# Patient Record
Sex: Female | Born: 1990 | Race: Black or African American | Hispanic: No | Marital: Single | Smoking: Never smoker
Health system: Southern US, Community
[De-identification: ages and names within clinical notes are randomized; demographics above are authoritative.]

## PROBLEM LIST (undated history)

## (undated) DIAGNOSIS — D649 Anemia, unspecified: Secondary | ICD-10-CM

## (undated) DIAGNOSIS — I10 Essential (primary) hypertension: Secondary | ICD-10-CM

## (undated) DIAGNOSIS — R519 Headache, unspecified: Secondary | ICD-10-CM

## (undated) HISTORY — DX: Anemia, unspecified: D64.9

## (undated) HISTORY — DX: Headache, unspecified: R51.9

## (undated) HISTORY — PX: WISDOM TOOTH EXTRACTION: SHX21

---

## 2011-11-17 ENCOUNTER — Ambulatory Visit: Payer: PRIVATE HEALTH INSURANCE

## 2011-11-17 DIAGNOSIS — J0100 Acute maxillary sinusitis, unspecified: Secondary | ICD-10-CM

## 2013-05-31 ENCOUNTER — Emergency Department (HOSPITAL_COMMUNITY)
Admission: EM | Admit: 2013-05-31 | Discharge: 2013-05-31 | Disposition: A | Discharge status: Home / Self Care | Payer: BC Managed Care – PPO | Attending: Emergency Medicine | Admitting: Emergency Medicine

## 2013-05-31 ENCOUNTER — Encounter (HOSPITAL_COMMUNITY): Payer: Self-pay | Admitting: Emergency Medicine

## 2013-05-31 DIAGNOSIS — L089 Local infection of the skin and subcutaneous tissue, unspecified: Secondary | ICD-10-CM | POA: Insufficient documentation

## 2013-05-31 DIAGNOSIS — Z880 Allergy status to penicillin: Secondary | ICD-10-CM | POA: Insufficient documentation

## 2013-05-31 DIAGNOSIS — W57XXXA Bitten or stung by nonvenomous insect and other nonvenomous arthropods, initial encounter: Secondary | ICD-10-CM

## 2013-05-31 DIAGNOSIS — Y939 Activity, unspecified: Secondary | ICD-10-CM | POA: Insufficient documentation

## 2013-05-31 DIAGNOSIS — Y929 Unspecified place or not applicable: Secondary | ICD-10-CM | POA: Insufficient documentation

## 2013-05-31 MED ORDER — HYDROCORTISONE 2.5 % EX LOTN: TOPICAL_LOTION | Freq: Two times a day (BID) | CUTANEOUS | Status: DC

## 2013-05-31 NOTE — ED Notes (Signed)
Patient with abscess like areas on left thigh and calf.  No drainage.  Pain only when something rubs against them.  Pain score 4/10.  Patient has never had them before.  Denies chills or fever.

## 2013-05-31 NOTE — ED Provider Notes (Signed)
History    This chart was scribed for non-physician practitioner Magnus Sinning, PA working with Benny Lennert, MD by Quintella Reichert, ED Scribe. This patient was seen in room WTR8/WTR8 and the patient's care was started at 4:24 PM.   CSN: [id number]  Arrival date & time 05/31/13  1553    Chief Complaint  Patient presents with  . Abscess    The history is provided by the patient. No language interpreter was used.     HPI Comments: Kayla Campbell is a 22 y.o. female who presents to the Emergency Department complaining of multiple gradually-worsening lesions on her left thigh that began yesterday.  Pt notes that the areas began as red swollen areas and since then the redness has spread and they have become hard.  They are mildly tender and itch.  She denies discharge from the areas.  She denies fever, chills, nausea, emesis, facial swelling, throat or tongue swelling, difficulty breathing or any other associated symptoms.  She denies recent insect bites or any other precipitating factors to her knowledge.  She denies prior h/o abscesses.   No history of DM.    History reviewed. No pertinent past medical history.   History reviewed. No pertinent past surgical history.   No family history on file.   History  Substance Use Topics  . Smoking status: Never Smoker   . Smokeless tobacco: Not on file  . Alcohol Use: No    OB History   Grav Para Term Preterm Abortions TAB SAB Ect Mult Living                   Review of Systems  Constitutional: Negative for fever and chills.  HENT: Negative for facial swelling.   Respiratory: Negative for shortness of breath.   Gastrointestinal: Negative for nausea and vomiting.  Skin:       Lesions on left leg  All other systems reviewed and are negative.      Allergies  Penicillins  Home Medications  No current outpatient prescriptions on file.  BP 143/99  Pulse 96  Temp(Src) 98.9 F (37.2 C) (Oral)  Resp 20  Wt 230  lb (104.327 kg)  SpO2 98%  Physical Exam  Nursing note and vitals reviewed. Constitutional: She appears well-developed and well-nourished. No distress.  HENT:  Head: Normocephalic and atraumatic.  Mouth/Throat: Oropharynx is clear and moist. No oropharyngeal exudate.  No swelling of lips, tongue or oropharynx.  Eyes: Conjunctivae are normal.  Neck: Neck supple.  Cardiovascular: Normal rate, regular rhythm and normal heart sounds.   No murmur heard. Pulmonary/Chest: Effort normal and breath sounds normal. No respiratory distress. She has no wheezes. She has no rales.  Neurological: She is alert.  Skin: Skin is warm and dry.  3 discrete erythematous circular areas on the left inner thigh.  No drainage.  No fluctuance palpated.  No erythematous streaking.    Psychiatric: She has a normal mood and affect. Her behavior is normal.    ED Course  Procedures (including critical care time)  DIAGNOSTIC STUDIES: Oxygen Saturation is 98% on room air, normal by my interpretation.    COORDINATION OF CARE: 4:28 PM-Discussed treatment plan which includes ice, hydrocortisone and Benadryl with pt at bedside and pt agreed to plan.     Labs Reviewed - No data to display  No results found.  No diagnosis found.  MDM  Patient presenting with three pruritic erythematous lesions of her left inner thigh.  No fluctuance present.  Appearance most consistent with insect bites.   Patient afebrile.  Feel that the patient is stable for discharge.  Patient instructed to apply Hydrocortisone to the area and take Benadryl as needed for itching.  Return precautions given.  I personally performed the services described in this documentation, which was scribed in my presence. The recorded information has been reviewed and is accurate.    Pascal Lux [place], PA-C 05/31/13 1826

## 2013-05-31 NOTE — ED Provider Notes (Signed)
Medical screening examination/treatment/procedure(s) were performed by non-physician practitioner and as supervising physician I was immediately available for consultation/collaboration.   Sandra Brents L Theia Dezeeuw, MD 05/31/13 2327 

## 2014-02-28 ENCOUNTER — Inpatient Hospital Stay (HOSPITAL_COMMUNITY)
Admission: AD | Admit: 2014-02-28 | Discharge: 2014-02-28 | Disposition: A | Discharge status: Home / Self Care | Payer: BC Managed Care – PPO | Source: Ambulatory Visit | Attending: Obstetrics and Gynecology | Admitting: Obstetrics and Gynecology

## 2014-02-28 DIAGNOSIS — Z880 Allergy status to penicillin: Secondary | ICD-10-CM | POA: Insufficient documentation

## 2014-02-28 DIAGNOSIS — A5400 Gonococcal infection of lower genitourinary tract, unspecified: Secondary | ICD-10-CM | POA: Insufficient documentation

## 2014-02-28 DIAGNOSIS — A549 Gonococcal infection, unspecified: Secondary | ICD-10-CM

## 2014-02-28 MED ORDER — AZITHROMYCIN 250 MG PO TABS
1000.0000 mg | ORAL_TABLET | Freq: Once | ORAL | Status: AC
  Administered 2014-02-28: 1000 mg via ORAL
  Filled 2014-02-28: qty 4

## 2014-02-28 MED ORDER — CEFTRIAXONE SODIUM 250 MG IJ SOLR
250.0000 mg | Freq: Once | INTRAMUSCULAR | Status: AC
  Administered 2014-02-28: 250 mg via INTRAMUSCULAR
  Filled 2014-02-28: qty 250

## 2014-02-28 NOTE — MAU Note (Signed)
Pt presents for rocephin injection for + Chalmydia. Orders received from Eveline KetoKathy Harris NP yesterday evening.

## 2014-02-28 NOTE — MAU Provider Note (Signed)
CC:  STD treatment S:  Called by the office to come to MAU to get treatment for gonorrhea. States she was coming for an injection.  No medications called in to a pharmacy. Has a penicillin allergy  - rash as a baby.  Took Benadryl PO prior to coming today as instructed by the office. O:  Vital signs stable.  Client distressed by diagnosis of GC but otherwise stable. A:  Needs treatment for gonorrhea. P: will give Rocephin 250 mg IM and Azithromycin 1000 mg PO according to current CDC treatment guidelines for gonorrhea.  Client agrees with the plan.  Partner has been treated.  Advised no sex x 7 days.  Condoms always for STD prevention.

## 2018-08-25 ENCOUNTER — Emergency Department (HOSPITAL_COMMUNITY)
Admission: EM | Admit: 2018-08-25 | Discharge: 2018-08-26 | Disposition: A | Discharge status: Home / Self Care | Payer: BLUE CROSS/BLUE SHIELD | Attending: Emergency Medicine | Admitting: Emergency Medicine

## 2018-08-25 ENCOUNTER — Encounter (HOSPITAL_COMMUNITY): Payer: Self-pay | Admitting: Emergency Medicine

## 2018-08-25 DIAGNOSIS — I159 Secondary hypertension, unspecified: Secondary | ICD-10-CM

## 2018-08-25 DIAGNOSIS — R519 Headache, unspecified: Secondary | ICD-10-CM

## 2018-08-25 DIAGNOSIS — R51 Headache: Secondary | ICD-10-CM | POA: Insufficient documentation

## 2018-08-25 DIAGNOSIS — I10 Essential (primary) hypertension: Secondary | ICD-10-CM | POA: Insufficient documentation

## 2018-08-25 HISTORY — DX: Essential (primary) hypertension: I10

## 2018-08-25 MED ORDER — LISINOPRIL 10 MG PO TABS: 10.0000 mg | ORAL_TABLET | Freq: Once | ORAL | Status: DC

## 2018-08-25 MED ORDER — ACETAMINOPHEN 500 MG PO TABS
1000.0000 mg | ORAL_TABLET | Freq: Once | ORAL | Status: AC
  Administered 2018-08-26: 1000 mg via ORAL
  Filled 2018-08-25: qty 2

## 2018-08-25 NOTE — ED Triage Notes (Signed)
Pt reports HA off/on X few days, checked her BP tonight and noted to be elevated. Pt states she has high BP and has been off her meds X few weeks.

## 2018-08-25 NOTE — ED Provider Notes (Signed)
[facility] EMERGENCY DEPARTMENT Provider Note  CSN: [id number] Arrival date & time: 08/25/18 2203  Chief Complaint(s) Hypertension  HPI Kayla Campbell is a 27 y.o. female   The history is provided by the patient.  Headache   This is a recurrent problem. The current episode started 2 days ago. The problem occurs constantly. The problem has not changed since onset.Associated with: elevated BP. Pain location: generalized. The quality of the pain is described as throbbing. The pain is moderate. The pain does not radiate. Pertinent negatives include no fever, no malaise/fatigue, no chest pressure, no near-syncope, no shortness of breath, no nausea and no vomiting. She has tried nothing for the symptoms.   Patient also noticed that when she gets her headaches she has elevated blood pressures which she has noted for the past couple days.  States that she is been off of her lisinopril since December.    Past Medical History Past Medical History:  Diagnosis Date  . Hypertension    There are no active problems to display for this patient.  Home Medication(s) Prior to Admission medications   Medication Sig Start Date End Date Taking? Authorizing Provider  hydrocortisone 2.5 % lotion Apply topically 2 (two) times daily. Patient not taking: Reported on 08/26/2018 05/31/13   Santiago Glad, PA-C                                                                                                                                    Past Surgical History History reviewed. No pertinent surgical history. Family History No family history on file.  Social History Social History   Tobacco Use  . Smoking status: Never Smoker  . Smokeless tobacco: Never Used  Substance Use Topics  . Alcohol use: Yes    Comment: Socially   . Drug use: Not Currently   Allergies Penicillins and Smz-tmp ds [sulfamethoxazole-trimethoprim]  Review of Systems Review of Systems  Constitutional:  Negative for fever and malaise/fatigue.  Respiratory: Negative for shortness of breath.   Cardiovascular: Negative for near-syncope.  Gastrointestinal: Negative for nausea and vomiting.  Neurological: Positive for headaches.   All other systems are reviewed and are negative for acute change except as noted in the HPI  Physical Exam Vital Signs  I have reviewed the triage vital signs BP (!) 173/111   Pulse 68   Temp 98.9 F (37.2 C) (Oral)   Resp 18   Ht 5\' 1"  (1.549 m)   Wt 97.5 kg   SpO2 100%   BMI 40.62 kg/m   Physical Exam  Constitutional: She is oriented to person, place, and time. She appears well-developed and well-nourished. No distress.  HENT:  Head: Normocephalic and atraumatic.  Right Ear: External ear normal.  Left Ear: External ear normal.  Nose: Nose normal.  Eyes: Conjunctivae and EOM are normal. No scleral icterus.  Fundoscopic exam:      The right eye shows no  papilledema.       The left eye shows no papilledema.  Neck: Normal range of motion and phonation normal.  Cardiovascular: Normal rate and regular rhythm.  Pulmonary/Chest: Effort normal. No stridor. No respiratory distress.  Abdominal: She exhibits no distension.  Musculoskeletal: Normal range of motion. She exhibits no edema.  Neurological: She is alert and oriented to person, place, and time.  Mental Status:  Alert and oriented to person, place, and time.  Attention and concentration normal.  Speech clear.  Recent memory is intact  Cranial Nerves:  II Visual Fields: Intact to confrontation. Visual fields intact. III, IV, VI: Pupils equal and reactive to light and near. Full eye movement without nystagmus  V Facial Sensation: Normal. No weakness of masticatory muscles  VII: No facial weakness or asymmetry  VIII Auditory Acuity: Grossly normal  IX/X: The uvula is midline; the palate elevates symmetrically  XI: Normal sternocleidomastoid and trapezius strength  XII: The tongue is midline. No  atrophy or fasciculations.   Motor System: Muscle Strength: 5/5 and symmetric in the upper and lower extremities. No pronation or drift.  Muscle Tone: Tone and muscle bulk are normal in the upper and lower extremities.   Reflexes: DTRs: 1+ and symmetrical in all four extremities. No Clonus Coordination: I No tremor.  Sensation: Intact to light touch, and pinprick. Gait: Routine gait normal.   Skin: She is not diaphoretic.  Psychiatric: She has a normal mood and affect. Her behavior is normal.  Vitals reviewed.   ED Results and Treatments Labs (all labs ordered are listed, but only abnormal results are displayed) Labs Reviewed  POC URINE PREG, ED                                                                                                                         EKG  EKG Interpretation  Date/Time:    Ventricular Rate:    PR Interval:    QRS Duration:   QT Interval:    QTC Calculation:   R Axis:     Text Interpretation:        Radiology No results found. Pertinent labs & imaging results that were available during my care of the patient were reviewed by me and considered in my medical decision making (see chart for details).  Medications Ordered in ED Medications  acetaminophen (TYLENOL) tablet 1,000 mg (1,000 mg Oral Given 08/26/18 0042)  Procedures Procedures  (including critical care time)  Medical Decision Making / ED Course I have reviewed the nursing notes for this encounter and the patient's prior records (if available in EHR or on provided paperwork).    Non focal neuro exam. No recent head trauma. No fever. Doubt meningitis. Doubt intracranial bleed. Doubt IIH. No indication for imaging.   Patient with elevated blood pressure but besides headache, she is asymptomatic.  Given Tylenol resulting in significant relief of her  headache and improved blood pressures with systolics down to the 130s.  No need for additional work-up.  Discussed PCP follow-up.  The patient appears reasonably screened and/or stabilized for discharge and I doubt any other medical condition or other [facility] requiring further screening, evaluation, or treatment in the ED at this time prior to discharge.  The patient is safe for discharge with strict return precautions.    Final Clinical Impression(s) / ED Diagnoses Final diagnoses:  Bad headache  Secondary hypertension   Disposition: Discharge  Condition: Good  I have discussed the results, Dx and Tx plan with the patient who expressed understanding and agree(s) with the plan. Discharge instructions discussed at great length. The patient was given strict return precautions who verbalized understanding of the instructions. No further questions at time of discharge.    ED Discharge Orders    None       Follow Up: Center, [facility] [address] [place] [address] [phone]  Schedule an appointment as soon as possible for a visit        This chart was dictated using voice recognition software.  Despite best efforts to proofread,  errors can occur which can change the documentation meaning.   Nira Conn, MD 08/26/18 [phone]

## 2018-08-25 NOTE — ED Notes (Signed)
Headache for 3-4 days high bp today  Was on bp med previously but  Has been off the med for awhile

## 2018-08-26 LAB — POC URINE PREG, ED: PREG TEST UR: NEGATIVE

## 2018-08-26 NOTE — ED Notes (Signed)
Up to the br 

## 2019-06-05 ENCOUNTER — Emergency Department (HOSPITAL_COMMUNITY)
Admission: EM | Admit: 2019-06-05 | Discharge: 2019-06-05 | Disposition: A | Discharge status: Home / Self Care | Payer: BC Managed Care – PPO | Attending: Family Medicine | Admitting: Family Medicine

## 2019-06-05 ENCOUNTER — Other Ambulatory Visit: Payer: Self-pay

## 2019-06-05 ENCOUNTER — Encounter (HOSPITAL_COMMUNITY): Payer: Self-pay

## 2019-06-05 ENCOUNTER — Emergency Department (HOSPITAL_COMMUNITY): Payer: BC Managed Care – PPO

## 2019-06-05 DIAGNOSIS — R110 Nausea: Secondary | ICD-10-CM | POA: Diagnosis not present

## 2019-06-05 DIAGNOSIS — R1084 Generalized abdominal pain: Secondary | ICD-10-CM | POA: Diagnosis present

## 2019-06-05 DIAGNOSIS — I10 Essential (primary) hypertension: Secondary | ICD-10-CM | POA: Diagnosis not present

## 2019-06-05 LAB — COMPREHENSIVE METABOLIC PANEL
ALT: 19 U/L (ref 0–44)
AST: 28 U/L (ref 15–41)
Albumin: 4.2 g/dL (ref 3.5–5.0)
Alkaline Phosphatase: 107 U/L (ref 38–126)
Anion gap: 10 (ref 5–15)
BUN: 13 mg/dL (ref 6–20)
CO2: 27 mmol/L (ref 22–32)
Calcium: 9.4 mg/dL (ref 8.9–10.3)
Chloride: 102 mmol/L (ref 98–111)
Creatinine, Ser: 0.73 mg/dL (ref 0.44–1.00)
GFR calc Af Amer: >60 mL/min (ref >60)
GFR calc non Af Amer: >60 mL/min (ref >60)
Glucose, Bld: 102 mg/dL — ABNORMAL HIGH (ref 70–99)
Potassium: 3.7 mmol/L (ref 3.5–5.1)
Sodium: 139 mmol/L (ref 135–145)
Total Bilirubin: 0.6 mg/dL (ref 0.3–1.2)
Total Protein: 8.6 g/dL — ABNORMAL HIGH (ref 6.5–8.1)

## 2019-06-05 LAB — CBC
HCT: 34.8 % — ABNORMAL LOW (ref 36.0–46.0)
Hemoglobin: 11.3 g/dL — ABNORMAL LOW (ref 12.0–15.0)
MCH: 26.3 pg (ref 26.0–34.0)
MCHC: 32.5 g/dL (ref 30.0–36.0)
MCV: 81.1 fL (ref 80.0–100.0)
Platelets: 384 10*3/uL (ref 150–400)
RBC: 4.29 MIL/uL (ref 3.87–5.11)
RDW: 14.4 % (ref 11.5–15.5)
WBC: 10.2 10*3/uL (ref 4.0–10.5)
nRBC: 0 % (ref 0.0–0.2)

## 2019-06-05 LAB — LIPASE, BLOOD: Lipase: 47 U/L (ref 11–51)

## 2019-06-05 LAB — I-STAT BETA HCG BLOOD, ED (MC, WL, AP ONLY): I-stat hCG, quantitative: <5 m[IU]/mL (ref <5)

## 2019-06-05 MED ORDER — SODIUM CHLORIDE 0.9% FLUSH: 3.0000 mL | Freq: Once | INTRAVENOUS | Status: DC

## 2019-06-05 MED ORDER — OMEPRAZOLE 20 MG PO CPDR: 20.0000 mg | DELAYED_RELEASE_CAPSULE | Freq: Every day | ORAL | 0 refills | Status: DC

## 2019-06-05 MED ORDER — ONDANSETRON HCL 4 MG PO TABS: 4.0000 mg | ORAL_TABLET | Freq: Three times a day (TID) | ORAL | 0 refills | Status: DC | PRN

## 2019-06-05 MED ORDER — ONDANSETRON HCL 4 MG/2ML IJ SOLN: 4.0000 mg | Freq: Once | INTRAMUSCULAR | Status: DC

## 2019-06-05 NOTE — ED Notes (Signed)
US at bedside

## 2019-06-05 NOTE — ED Provider Notes (Signed)
[facility] DEPT Provider Note   CSN: [id number] Arrival date & time: 06/05/19  1246     History   Chief Complaint Chief Complaint  Patient presents with  . Abdominal Pain  . Emesis    HPI Kayla Campbell is a 28 y.o. female.     The history is provided by the patient. No language interpreter was used.  Abdominal Pain Associated symptoms: vomiting   Emesis Associated symptoms: abdominal pain      28 year old female presenting for evaluation of abdominal pain.  Patient report for the past 2 months she has had recurrent bouts of nausea.  States that in the morning if she eats breakfast she feels very nauseous.  She tries to avoid eating breakfast to avoid her nausea.  For the past several days she also endorsed having nausea with vomiting nonbloody but bilious content.  Symptoms worsen with eating in the morning.  She also endorsed diffuse abdominal cramping.  Pain is across mid abdomen and comes in waves.  Earlier pain was intense but now rates pain as 3 out of 10.  There is no associated fever chills no chest pain shortness of breath productive cough or dysuria.  No recent sick exposure or recent travel.  Last menstrual period was over a week ago.  Patient denies being sexually active.  No specific treatment tried at home.  Past Medical History:  Diagnosis Date  . Hypertension     There are no active problems to display for this patient.   Past Surgical History:  Procedure Laterality Date  . WISDOM TOOTH EXTRACTION       OB History   No obstetric history on file.      Home Medications    Prior to Admission medications   Medication Sig Start Date End Date Taking? Authorizing Provider  hydrocortisone 2.5 % lotion Apply topically 2 (two) times daily. Patient not taking: Reported on 08/26/2018 05/31/13   Hyman Bible, PA-C    Family History Family History  Problem Relation Age of Onset  . Hypertension Mother     Social History  Social History   Tobacco Use  . Smoking status: Never Smoker  . Smokeless tobacco: Never Used  Substance Use Topics  . Alcohol use: Yes    Comment: Socially   . Drug use: Not Currently     Allergies   Penicillins and Smz-tmp ds [sulfamethoxazole-trimethoprim]   Review of Systems Review of Systems  Gastrointestinal: Positive for abdominal pain and vomiting.  All other systems reviewed and are negative.    Physical Exam Updated Vital Signs BP (!) 151/104 (BP Location: Left Arm)   Pulse 88   Temp 98.5 F (36.9 C) (Oral)   Resp 16   Ht 5' 1.5" (1.562 m)   Wt 108.9 kg   LMP 05/26/2019   SpO2 100%   BMI 44.61 kg/m   Physical Exam Vitals signs and nursing note reviewed.  Constitutional:      General: She is not in acute distress.    Appearance: She is well-developed. She is obese.  HENT:     Head: Atraumatic.  Eyes:     Conjunctiva/sclera: Conjunctivae normal.  Neck:     Musculoskeletal: Neck supple.  Cardiovascular:     Rate and Rhythm: Normal rate and regular rhythm.     Heart sounds: Normal heart sounds.  Pulmonary:     Effort: Pulmonary effort is normal.     Breath sounds: Normal breath sounds.  Abdominal:  General: Abdomen is flat.     Palpations: Abdomen is soft.     Tenderness: There is generalized abdominal tenderness. There is no guarding or rebound. Negative signs include Murphy's sign, Rovsing's sign and McBurney's sign.     Hernia: No hernia is present.  Skin:    General: Skin is warm.     Findings: No rash.  Neurological:     Mental Status: She is alert.  Psychiatric:        Mood and Affect: Mood normal.      ED Treatments / Results  Labs (all labs ordered are listed, but only abnormal results are displayed) Labs Reviewed  COMPREHENSIVE METABOLIC PANEL - Abnormal; Notable for the following components:      Result Value   Glucose, Bld 102 (*)    Total Protein 8.6 (*)    All other components within normal limits  CBC - Abnormal;  Notable for the following components:   Hemoglobin 11.3 (*)    HCT 34.8 (*)    All other components within normal limits  LIPASE, BLOOD  URINALYSIS, ROUTINE W REFLEX MICROSCOPIC  I-STAT BETA HCG BLOOD, ED ([facility], [facility], [facility] ONLY)    EKG None  Radiology [place] Abdomen Limited  Result Date: 06/05/2019 CLINICAL DATA:  Abdominal pain with nausea and vomiting. EXAM: ULTRASOUND ABDOMEN LIMITED RIGHT UPPER QUADRANT COMPARISON:  None. FINDINGS: Gallbladder: No gallstones or wall thickening visualized. No sonographic Murphy sign noted by sonographer. Common bile duct: Diameter: 3 mm, normal Liver: No focal lesion identified. Within normal limits in parenchymal echogenicity. Portal vein is patent on color Doppler imaging with normal direction of blood flow towards the liver. IMPRESSION: Normal right upper quadrant ultrasound. No abnormality seen to explain the clinical presentation. Electronically Signed   By: Paulina FusiMark  Shogry M.D.   On: 06/05/2019 14:36    Procedures Procedures (including critical care time)  Medications Ordered in ED Medications  sodium chloride flush (NS) 0.9 % injection 3 mL (has no administration in time range)  ondansetron (ZOFRAN) injection 4 mg (has no administration in time range)     Initial Impression / Assessment and Plan / ED Course  I have reviewed the triage vital signs and the nursing notes.  Pertinent labs & imaging results that were available during my care of the patient were reviewed by me and considered in my medical decision making (see chart for details).        BP (!) 155/88   Pulse 94   Temp 98.5 F (36.9 C) (Oral)   Resp 16   Ht 5' 1.5" (1.562 m)   Wt 108.9 kg   LMP 05/26/2019   SpO2 100%   BMI 44.61 kg/m    Final Clinical Impressions(s) / ED Diagnoses   Final diagnoses:  Nausea in adult    ED Discharge Orders         Ordered    ondansetron (ZOFRAN) 4 MG tablet  Every 8 hours PRN     06/05/19 1458    omeprazole (PRILOSEC) 20 MG capsule   Daily     06/05/19 1458         1:44 PM Patient endorsed symptoms of nausea and vomiting associate with eating as well as having abdominal pain.  Although she does not have any reproducible right upper quadrant tenderness, it may be prudent to obtain limited abdominal ultrasound to rule out biliary disease.  Work-up initiated.  Patient otherwise well-appearing.  Low suspicion for appendicitis.  2:55 PM Pregnancy is negative, normal  lipase, normal electrolyte panels, normal liver function, normal kidney function, normal WBC and H&H, limited abdominal ultrasound without any acute finding.  Patient is currently resting comfortably.  Patient will be discharged home with antinausea medication to use as needed however I encourage patient to follow-up with primary care provider for further evaluation.  Return precautions discussed.  No acute pathology identified during this visit.   Fayrene Helperran, Chaya Dehaan, PA-C 06/05/19 1459    Long, Arlyss RepressJoshua G, MD 06/05/19 1925

## 2019-10-15 LAB — OB RESULTS CONSOLE ANTIBODY SCREEN: Antibody Screen: NEGATIVE

## 2019-10-15 LAB — OB RESULTS CONSOLE ABO/RH: RH Type: POSITIVE

## 2019-10-15 LAB — OB RESULTS CONSOLE HIV ANTIBODY (ROUTINE TESTING): HIV: NONREACTIVE

## 2019-10-15 LAB — OB RESULTS CONSOLE HEPATITIS B SURFACE ANTIGEN: Hepatitis B Surface Ag: NEGATIVE

## 2019-10-15 LAB — OB RESULTS CONSOLE GC/CHLAMYDIA
Chlamydia: NEGATIVE
Gonorrhea: NEGATIVE

## 2019-10-15 LAB — OB RESULTS CONSOLE RUBELLA ANTIBODY, IGM: Rubella: NON-IMMUNE/NOT IMMUNE

## 2019-10-15 LAB — OB RESULTS CONSOLE RPR: RPR: NONREACTIVE

## 2020-04-30 ENCOUNTER — Encounter (HOSPITAL_COMMUNITY): Payer: Self-pay | Admitting: *Deleted

## 2020-04-30 ENCOUNTER — Telehealth (HOSPITAL_COMMUNITY): Payer: Self-pay | Admitting: *Deleted

## 2020-04-30 NOTE — Telephone Encounter (Signed)
Preadmission screen  

## 2020-05-04 ENCOUNTER — Encounter (HOSPITAL_COMMUNITY): Payer: Self-pay | Admitting: *Deleted

## 2020-05-04 ENCOUNTER — Telehealth (HOSPITAL_COMMUNITY): Payer: Self-pay | Admitting: *Deleted

## 2020-05-04 NOTE — Telephone Encounter (Signed)
Preadmission screen  

## 2020-05-06 ENCOUNTER — Other Ambulatory Visit: Payer: Self-pay

## 2020-05-06 ENCOUNTER — Inpatient Hospital Stay (HOSPITAL_COMMUNITY)
Admission: AD | Admit: 2020-05-06 | Discharge: 2020-05-06 | Disposition: A | Discharge status: Home / Self Care | Payer: 59 | Attending: Obstetrics and Gynecology | Admitting: Obstetrics and Gynecology

## 2020-05-06 ENCOUNTER — Encounter (HOSPITAL_COMMUNITY): Payer: Self-pay | Admitting: Obstetrics and Gynecology

## 2020-05-06 DIAGNOSIS — Z880 Allergy status to penicillin: Secondary | ICD-10-CM | POA: Insufficient documentation

## 2020-05-06 DIAGNOSIS — R030 Elevated blood-pressure reading, without diagnosis of hypertension: Secondary | ICD-10-CM | POA: Diagnosis present

## 2020-05-06 DIAGNOSIS — O10919 Unspecified pre-existing hypertension complicating pregnancy, unspecified trimester: Secondary | ICD-10-CM

## 2020-05-06 DIAGNOSIS — O10013 Pre-existing essential hypertension complicating pregnancy, third trimester: Secondary | ICD-10-CM | POA: Diagnosis not present

## 2020-05-06 DIAGNOSIS — Z882 Allergy status to sulfonamides status: Secondary | ICD-10-CM | POA: Insufficient documentation

## 2020-05-06 DIAGNOSIS — Z7982 Long term (current) use of aspirin: Secondary | ICD-10-CM | POA: Diagnosis not present

## 2020-05-06 DIAGNOSIS — Z8249 Family history of ischemic heart disease and other diseases of the circulatory system: Secondary | ICD-10-CM | POA: Insufficient documentation

## 2020-05-06 DIAGNOSIS — Z79899 Other long term (current) drug therapy: Secondary | ICD-10-CM | POA: Diagnosis not present

## 2020-05-06 DIAGNOSIS — O10913 Unspecified pre-existing hypertension complicating pregnancy, third trimester: Secondary | ICD-10-CM | POA: Diagnosis not present

## 2020-05-06 DIAGNOSIS — Z3A38 38 weeks gestation of pregnancy: Secondary | ICD-10-CM | POA: Diagnosis not present

## 2020-05-06 DIAGNOSIS — Z3689 Encounter for other specified antenatal screening: Secondary | ICD-10-CM

## 2020-05-06 LAB — CBC
HCT: 32.4 % — ABNORMAL LOW (ref 36.0–46.0)
Hemoglobin: 10.7 g/dL — ABNORMAL LOW (ref 12.0–15.0)
MCH: 26.6 pg (ref 26.0–34.0)
MCHC: 33 g/dL (ref 30.0–36.0)
MCV: 80.4 fL (ref 80.0–100.0)
Platelets: 297 10*3/uL (ref 150–400)
RBC: 4.03 MIL/uL (ref 3.87–5.11)
RDW: 15.4 % (ref 11.5–15.5)
WBC: 10.5 10*3/uL (ref 4.0–10.5)
nRBC: 0 % (ref 0.0–0.2)

## 2020-05-06 LAB — PROTEIN / CREATININE RATIO, URINE
Creatinine, Urine: 68.11 mg/dL
Total Protein, Urine: <6 mg/dL

## 2020-05-06 LAB — COMPREHENSIVE METABOLIC PANEL
ALT: 15 U/L (ref 0–44)
AST: 21 U/L (ref 15–41)
Albumin: 2.9 g/dL — ABNORMAL LOW (ref 3.5–5.0)
Alkaline Phosphatase: 96 U/L (ref 38–126)
Anion gap: 9 (ref 5–15)
BUN: 6 mg/dL (ref 6–20)
CO2: 24 mmol/L (ref 22–32)
Calcium: 9.4 mg/dL (ref 8.9–10.3)
Chloride: 104 mmol/L (ref 98–111)
Creatinine, Ser: 0.85 mg/dL (ref 0.44–1.00)
GFR calc Af Amer: >60 mL/min (ref >60)
GFR calc non Af Amer: >60 mL/min (ref >60)
Glucose, Bld: 96 mg/dL (ref 70–99)
Potassium: 4 mmol/L (ref 3.5–5.1)
Sodium: 137 mmol/L (ref 135–145)
Total Bilirubin: 0.1 mg/dL — ABNORMAL LOW (ref 0.3–1.2)
Total Protein: 6.9 g/dL (ref 6.5–8.1)

## 2020-05-06 NOTE — Discharge Instructions (Signed)

## 2020-05-06 NOTE — MAU Note (Signed)
Pt was seen in office today for routine appointment and had elevated BPs.  Denies HA, visual changes, or pain.  Sent to MAU for preE workup.  Reports good fetal movement.  Denies LOF or vaginal bleeding.

## 2020-05-06 NOTE — MAU Provider Note (Signed)
History     CSN: [id number]  Arrival date and time: 05/06/20 1655   First Provider Initiated Contact with Patient 05/06/20 1833      Chief Complaint  Patient presents with  . Hypertension   Kayla Campbell is a 29 y.o. G1P0 at [redacted]w[redacted]d who receives care at [facility].  She presents today for Hypertension.  Patient was sent from office due to elevated blood pressures at her appt today.  Patient reports that she takes labetalol twice daily and took her doses at 0730am and 1130am. Patient denies HA, visual disturbances, and RUQ pain.  She reports some numbness in her right hand, but only in the morning.  Patient endorses fetal movement and denies vaginal concerns including bleeding, leaking, and discharge.  She also denies abdominal cramping and contractions. Patient states she was checked in the office and was 1cm.  Patient scheduled for IOL on Monday June 21st at [facility].      OB History    Gravida  1   Para      Term      Preterm      AB      Living        SAB      TAB      Ectopic      Multiple      Live Births              Past Medical History:  Diagnosis Date  . Anemia   . Headache   . Hypertension     Past Surgical History:  Procedure Laterality Date  . WISDOM TOOTH EXTRACTION      Family History  Problem Relation Age of Onset  . Hypertension Mother     Social History   Tobacco Use  . Smoking status: Never Smoker  . Smokeless tobacco: Never Used  Vaping Use  . Vaping Use: Never used  Substance Use Topics  . Alcohol use: Yes    Comment: Socially   . Drug use: Not Currently    Allergies:  Allergies  Allergen Reactions  . Penicillins Other (See Comments)    childhood  . Smz-Tmp Ds [Sulfamethoxazole-Trimethoprim] Hives    Medications Prior to Admission  Medication Sig Dispense Refill Last Dose  . aspirin 81 MG chewable tablet Chew by mouth daily.   05/06/2020 at Unknown time  . ferrous sulfate 325 (65 FE) MG tablet Take 325 mg by  mouth daily with breakfast. Unsure of which medication--iron pill   05/06/2020 at Unknown time  . labetalol (NORMODYNE) 100 MG tablet Take 100 mg by mouth 2 (two) times daily.   05/06/2020 at Unknown time  . hydrocortisone 2.5 % lotion Apply topically 2 (two) times daily. (Patient not taking: Reported on 08/26/2018) 59 mL 0   . omeprazole (PRILOSEC) 20 MG capsule Take 1 capsule (20 mg total) by mouth daily. 30 capsule 0   . ondansetron (ZOFRAN) 4 MG tablet Take 1 tablet (4 mg total) by mouth every 8 (eight) hours as needed for nausea or vomiting. 12 tablet 0     Review of Systems  Eyes: Negative for visual disturbance.  Respiratory: Negative for cough, chest tightness and shortness of breath.   Gastrointestinal: Negative for abdominal pain, nausea and vomiting.  Genitourinary: Negative for vaginal bleeding and vaginal discharge.  Neurological: Negative for dizziness, light-headedness and headaches.   Physical Exam   Blood pressure 126/74, pulse 92, temperature 98.5 F (36.9 C), temperature source Oral, resp. rate 18, weight 134.5 kg,  last menstrual period 05/26/2019, SpO2 100 %.  Physical Exam  Constitutional: She is oriented to person, place, and time.  HENT:  Head: Normocephalic and atraumatic.  Eyes: Conjunctivae are normal.  Cardiovascular: Normal rate and regular rhythm.  Respiratory: Effort normal and breath sounds normal. No respiratory distress.  GI: Normal appearance.  Gravid--fundal height appears LGA, Soft, NT   Musculoskeletal:        General: Normal range of motion.     Cervical back: Normal range of motion.     Right lower leg: Edema present.     Left lower leg: Edema present.  Neurological: She is alert and oriented to person, place, and time.  Skin: Skin is warm and dry.  Psychiatric: Mood and thought content normal.    Fetal Assessment 140 bpm, Mod Var, -Decels, +Accels Toco: No ctx noted  MAU Course   Results for orders placed or performed during the  hospital encounter of 05/06/20 (from the past 24 hour(s))  Protein / creatinine ratio, urine     Status: None   Collection Time: 05/06/20  5:59 PM  Result Value Ref Range   Creatinine, Urine 68.11 mg/dL   Total Protein, Urine <6 mg/dL   Protein Creatinine Ratio        0.00 - 0.15 mg/mg[Cre]  CBC     Status: Abnormal   Collection Time: 05/06/20  6:07 PM  Result Value Ref Range   WBC 10.5 4.0 - 10.5 K/uL   RBC 4.03 3.87 - 5.11 MIL/uL   Hemoglobin 10.7 (L) 12.0 - 15.0 g/dL   HCT 85.2 (L) 36 - 46 %   MCV 80.4 80.0 - 100.0 fL   MCH 26.6 26.0 - 34.0 pg   MCHC 33.0 30.0 - 36.0 g/dL   RDW 77.8 24.2 - 35.3 %   Platelets 297 150 - 400 K/uL   nRBC 0.0 0.0 - 0.2 %  Comprehensive metabolic panel     Status: Abnormal   Collection Time: 05/06/20  6:07 PM  Result Value Ref Range   Sodium 137 135 - 145 mmol/L   Potassium 4.0 3.5 - 5.1 mmol/L   Chloride 104 98 - 111 mmol/L   CO2 24 22 - 32 mmol/L   Glucose, Bld 96 70 - 99 mg/dL   BUN 6 6 - 20 mg/dL   Creatinine, Ser 6.14 0.44 - 1.00 mg/dL   Calcium 9.4 8.9 - 43.1 mg/dL   Total Protein 6.9 6.5 - 8.1 g/dL   Albumin 2.9 (L) 3.5 - 5.0 g/dL   AST 21 15 - 41 U/L   ALT 15 0 - 44 U/L   Alkaline Phosphatase 96 38 - 126 U/L   Total Bilirubin 0.1 (L) 0.3 - 1.2 mg/dL   GFR calc non Af Amer >60 >60 mL/min   GFR calc Af Amer >60 >60 mL/min   Anion gap 9 5 - 15   No results found.  MDM Physical Exam Labs: CBC, CMP, PC Ratio Measure BPQ15 min EFM Assessment and Plan  29 year old G1P0  SIUP at 54.3weeks Cat I FT CHTN  -POC reviewed. -Labs drawn. -Monitor BP -Informed that admission will be recommended if labs abnormal, but if not would discharge to home with plan to return to hospital on Sunday for scheduled IOL.  -Patient without questions or concerns. -Will await results.    Cherre Robins MSN, CNM 05/06/2020, 6:34 PM   Reassessment (7:25 PM) Vitals:   05/06/20 1815 05/06/20 1830 05/06/20 1845 05/06/20 1900  BP:  129/65 126/74 120/65  124/62  Pulse: 88 92 84 79  Resp:      Temp:      TempSrc:      SpO2:      Weight:        -Labs return normal. -BP remain normotensive. -Patient instructed to follow up as scheduled. -Encouraged to call primary ob or return to MAU if symptoms worsen or with the onset of new symptoms. -Discharged to home in stable condition.  Maryann Conners MSN, CNM Advanced Practice Provider, [facility] [employer] [facility]

## 2020-05-08 ENCOUNTER — Other Ambulatory Visit (HOSPITAL_COMMUNITY)
Admission: RE | Admit: 2020-05-08 | Discharge: 2020-05-08 | Disposition: A | Discharge status: Home / Self Care | Payer: 59 | Source: Ambulatory Visit | Attending: Obstetrics and Gynecology | Admitting: Obstetrics and Gynecology

## 2020-05-08 DIAGNOSIS — Z01812 Encounter for preprocedural laboratory examination: Secondary | ICD-10-CM | POA: Insufficient documentation

## 2020-05-08 DIAGNOSIS — Z20822 Contact with and (suspected) exposure to covid-19: Secondary | ICD-10-CM | POA: Insufficient documentation

## 2020-05-08 LAB — SARS CORONAVIRUS 2 (TAT 6-24 HRS): SARS Coronavirus 2: NEGATIVE

## 2020-05-08 MED ORDER — AMMONIA AROMATIC IN INHA
RESPIRATORY_TRACT | Status: AC
  Filled 2020-05-08: qty 10

## 2020-05-09 NOTE — H&P (Signed)
Kayla Campbell is a 29 y.o.prime female presenting  At 39.0wks for induction of labor due to exacerbation of chronic hypertension. Pt is dated per LMP which was confirmed with a 9 week [place]. Her BP ws controlled on labetalol 100mg  po bid but would have intermittent exercarbation. She has had several neg preE workups. Her pregnancy was also complicated by obesity, anemia and history of exercise induced syncopal event - none in pregnancy. She is rubella non immune. She declined all genetic and chromosomal screening tests. She is sars covid neg.  OB History    Gravida  1   Para      Term      Preterm      AB      Living        SAB      TAB      Ectopic      Multiple      Live Births             Past Medical History:  Diagnosis Date  . Anemia   . Headache   . Hypertension    Past Surgical History:  Procedure Laterality Date  . WISDOM TOOTH EXTRACTION     Family History: family history includes Hypertension in her mother. Social History:  reports that she has never smoked. She has never used smokeless tobacco. She reports current alcohol use. She reports previous drug use.     Maternal Diabetes: No Genetic Screening: Declined Maternal Ultrasounds/Referrals: Normal Fetal Ultrasounds or other Referrals:  None Maternal Substance Abuse:  No Significant Maternal Medications:  Meds include: Other:  Significant Maternal Lab Results:  Group B Strep negative Other Comments:  None  Review of Systems  Constitutional: Positive for fatigue.  Eyes: Negative for photophobia and visual disturbance.  Respiratory: Negative for chest tightness and shortness of breath.   Cardiovascular: Positive for leg swelling. Negative for chest pain and palpitations.  Gastrointestinal: Negative for constipation.  Genitourinary: Negative for pelvic pain.  Musculoskeletal: Positive for myalgias.  Neurological: Negative for headaches.  Psychiatric/Behavioral: The patient is not nervous/anxious.     Maternal Medical History:  Reason for admission: chronic hypertension with exercebation  Contractions: Frequency: rare.   Perceived severity is mild.    Fetal activity: Perceived fetal activity is normal.    Prenatal complications: PIH.   Prenatal Complications - Diabetes: none.      Last menstrual period 05/26/2019. Maternal Exam:  Uterine Assessment: Contraction strength is mild.  Contraction frequency is rare.   Abdomen: Patient reports generalized tenderness.  Estimated fetal weight is AGA.    Introitus: Normal vulva. Vulva is negative for condylomata and lesion.  Vagina is negative for condylomata and discharge.  Pelvis: adequate for delivery.      Physical Exam  Nursing note and vitals reviewed. Cardiovascular: Normal pulses.  GI: Soft. There is generalized abdominal tenderness.  Genitourinary:    Vulva normal.     No vulval condylomata or lesion noted.     No vaginal discharge.   Musculoskeletal:        General: Normal range of motion.     Cervical back: Normal range of motion.  Neurological: She is alert.  Skin: Skin is warm.  Psychiatric: Her behavior is normal. Mood, judgment and thought content normal.    Prenatal labs: ABO, Rh: A/Positive/-- (11/25 0000) Antibody: Negative (11/25 0000) Rubella: Nonimmune (11/25 0000) RPR: Nonreactive (11/25 0000)  HBsAg: Negative (11/25 0000)  HIV: Non-reactive (11/25 0000)  GBS:  Assessment/Plan: 29yo prime at 60 .0 weeks for iol due to exercebation of chronic hypertension  - Admit - Sars covid neg - GBS neg - cytotec for ripening; AROM/PIt to follow - PreE labs = Anticipate svd     Janean Sark Richa Shor 05/09/2020, 9:35 PM

## 2020-05-10 ENCOUNTER — Other Ambulatory Visit: Payer: Self-pay

## 2020-05-10 ENCOUNTER — Inpatient Hospital Stay (HOSPITAL_COMMUNITY): Payer: 59

## 2020-05-10 ENCOUNTER — Inpatient Hospital Stay (HOSPITAL_COMMUNITY): Payer: 59 | Admitting: Anesthesiology

## 2020-05-10 ENCOUNTER — Encounter (HOSPITAL_COMMUNITY): Payer: Self-pay | Admitting: Obstetrics and Gynecology

## 2020-05-10 ENCOUNTER — Inpatient Hospital Stay (HOSPITAL_COMMUNITY)
Admission: RE | Admit: 2020-05-10 | Discharge: 2020-05-12 | DRG: 787 | Disposition: A | Discharge status: Home / Self Care | Payer: 59 | Attending: Obstetrics and Gynecology | Admitting: Obstetrics and Gynecology

## 2020-05-10 DIAGNOSIS — D62 Acute posthemorrhagic anemia: Secondary | ICD-10-CM | POA: Diagnosis not present

## 2020-05-10 DIAGNOSIS — Z20822 Contact with and (suspected) exposure to covid-19: Secondary | ICD-10-CM | POA: Diagnosis present

## 2020-05-10 DIAGNOSIS — O1002 Pre-existing essential hypertension complicating childbirth: Principal | ICD-10-CM | POA: Diagnosis present

## 2020-05-10 DIAGNOSIS — O9081 Anemia of the puerperium: Secondary | ICD-10-CM | POA: Diagnosis not present

## 2020-05-10 DIAGNOSIS — O99214 Obesity complicating childbirth: Secondary | ICD-10-CM | POA: Diagnosis present

## 2020-05-10 DIAGNOSIS — Z3A39 39 weeks gestation of pregnancy: Secondary | ICD-10-CM | POA: Diagnosis not present

## 2020-05-10 DIAGNOSIS — O10919 Unspecified pre-existing hypertension complicating pregnancy, unspecified trimester: Secondary | ICD-10-CM | POA: Diagnosis present

## 2020-05-10 LAB — COMPREHENSIVE METABOLIC PANEL
ALT: 14 U/L (ref 0–44)
AST: 26 U/L (ref 15–41)
Albumin: 2.7 g/dL — ABNORMAL LOW (ref 3.5–5.0)
Alkaline Phosphatase: 90 U/L (ref 38–126)
Anion gap: 9 (ref 5–15)
BUN: 7 mg/dL (ref 6–20)
CO2: 21 mmol/L — ABNORMAL LOW (ref 22–32)
Calcium: 9.3 mg/dL (ref 8.9–10.3)
Chloride: 105 mmol/L (ref 98–111)
Creatinine, Ser: 0.75 mg/dL (ref 0.44–1.00)
GFR calc Af Amer: >60 mL/min (ref >60)
GFR calc non Af Amer: >60 mL/min (ref >60)
Glucose, Bld: 99 mg/dL (ref 70–99)
Potassium: 3.8 mmol/L (ref 3.5–5.1)
Sodium: 135 mmol/L (ref 135–145)
Total Bilirubin: 0.4 mg/dL (ref 0.3–1.2)
Total Protein: 6.6 g/dL (ref 6.5–8.1)

## 2020-05-10 LAB — CBC
HCT: 30.2 % — ABNORMAL LOW (ref 36.0–46.0)
Hemoglobin: 9.8 g/dL — ABNORMAL LOW (ref 12.0–15.0)
MCH: 26.2 pg (ref 26.0–34.0)
MCHC: 32.5 g/dL (ref 30.0–36.0)
MCV: 80.7 fL (ref 80.0–100.0)
Platelets: 277 10*3/uL (ref 150–400)
RBC: 3.74 MIL/uL — ABNORMAL LOW (ref 3.87–5.11)
RDW: 15.6 % — ABNORMAL HIGH (ref 11.5–15.5)
WBC: 8.8 10*3/uL (ref 4.0–10.5)
nRBC: 0 % (ref 0.0–0.2)

## 2020-05-10 LAB — CBC WITH DIFFERENTIAL/PLATELET
Abs Immature Granulocytes: 0.05 10*3/uL (ref 0.00–0.07)
Basophils Absolute: 0 10*3/uL (ref 0.0–0.1)
Basophils Relative: 0 %
Eosinophils Absolute: 0.1 10*3/uL (ref 0.0–0.5)
Eosinophils Relative: 1 %
HCT: 32.9 % — ABNORMAL LOW (ref 36.0–46.0)
Hemoglobin: 10.9 g/dL — ABNORMAL LOW (ref 12.0–15.0)
Immature Granulocytes: 1 %
Lymphocytes Relative: 16 %
Lymphs Abs: 1.3 10*3/uL (ref 0.7–4.0)
MCH: 27 pg (ref 26.0–34.0)
MCHC: 33.1 g/dL (ref 30.0–36.0)
MCV: 81.4 fL (ref 80.0–100.0)
Monocytes Absolute: 0.5 10*3/uL (ref 0.1–1.0)
Monocytes Relative: 6 %
Neutro Abs: 6.2 10*3/uL (ref 1.7–7.7)
Neutrophils Relative %: 76 %
Platelets: 267 10*3/uL (ref 150–400)
RBC: 4.04 MIL/uL (ref 3.87–5.11)
RDW: 15.9 % — ABNORMAL HIGH (ref 11.5–15.5)
WBC: 8.2 10*3/uL (ref 4.0–10.5)
nRBC: 0 % (ref 0.0–0.2)

## 2020-05-10 LAB — TYPE AND SCREEN
ABO/RH(D): A POS
Antibody Screen: NEGATIVE

## 2020-05-10 LAB — ABO/RH: ABO/RH(D): A POS

## 2020-05-10 LAB — RPR: RPR Ser Ql: NONREACTIVE

## 2020-05-10 LAB — URIC ACID: Uric Acid, Serum: 6.4 mg/dL (ref 2.5–7.1)

## 2020-05-10 LAB — LACTATE DEHYDROGENASE: LDH: 165 U/L (ref 98–192)

## 2020-05-10 LAB — PROTEIN / CREATININE RATIO, URINE
Creatinine, Urine: 68.2 mg/dL
Total Protein, Urine: <6 mg/dL

## 2020-05-10 MED ORDER — LIDOCAINE HCL (PF) 1 % IJ SOLN: 30.0000 mL | INTRAMUSCULAR | Status: DC | PRN

## 2020-05-10 MED ORDER — TERBUTALINE SULFATE 1 MG/ML IJ SOLN
0.2500 mg | Freq: Once | INTRAMUSCULAR | Status: AC | PRN
  Administered 2020-05-10: 0.25 mg via SUBCUTANEOUS
  Filled 2020-05-10: qty 1

## 2020-05-10 MED ORDER — ONDANSETRON HCL 4 MG/2ML IJ SOLN: 4.0000 mg | Freq: Four times a day (QID) | INTRAMUSCULAR | Status: DC | PRN

## 2020-05-10 MED ORDER — BUTORPHANOL TARTRATE 1 MG/ML IJ SOLN
1.0000 mg | INTRAMUSCULAR | Status: DC | PRN
  Administered 2020-05-10 (×3): 1 mg via INTRAVENOUS
  Filled 2020-05-10 (×3): qty 1

## 2020-05-10 MED ORDER — LIDOCAINE HCL (PF) 1 % IJ SOLN
INTRAMUSCULAR | Status: DC | PRN
  Administered 2020-05-10: 8 mL via EPIDURAL

## 2020-05-10 MED ORDER — SODIUM CHLORIDE (PF) 0.9 % IJ SOLN
INTRAMUSCULAR | Status: DC | PRN
  Administered 2020-05-10: 12 mL/h via EPIDURAL

## 2020-05-10 MED ORDER — LACTATED RINGERS IV SOLN
500.0000 mL | Freq: Once | INTRAVENOUS | Status: AC
  Administered 2020-05-10: 500 mL via INTRAVENOUS

## 2020-05-10 MED ORDER — SOD CITRATE-CITRIC ACID 500-334 MG/5ML PO SOLN
30.0000 mL | ORAL | Status: DC | PRN
  Administered 2020-05-11: 30 mL via ORAL
  Filled 2020-05-10: qty 30

## 2020-05-10 MED ORDER — EPHEDRINE 5 MG/ML INJ
10.0000 mg | INTRAVENOUS | Status: AC | PRN
  Administered 2020-05-10 (×2): 10 mg via INTRAVENOUS

## 2020-05-10 MED ORDER — DIPHENHYDRAMINE HCL 50 MG/ML IJ SOLN: 12.5000 mg | INTRAMUSCULAR | Status: DC | PRN

## 2020-05-10 MED ORDER — OXYTOCIN-SODIUM CHLORIDE 30-0.9 UT/500ML-% IV SOLN
1.0000 m[IU]/min | INTRAVENOUS | Status: DC
  Administered 2020-05-10: 2 m[IU]/min via INTRAVENOUS

## 2020-05-10 MED ORDER — PHENYLEPHRINE 40 MCG/ML (10ML) SYRINGE FOR IV PUSH (FOR BLOOD PRESSURE SUPPORT)
80.0000 ug | PREFILLED_SYRINGE | INTRAVENOUS | Status: DC | PRN
  Administered 2020-05-10 (×2): 80 ug via INTRAVENOUS

## 2020-05-10 MED ORDER — OXYTOCIN-SODIUM CHLORIDE 30-0.9 UT/500ML-% IV SOLN: 1.0000 m[IU]/min | INTRAVENOUS | Status: DC

## 2020-05-10 MED ORDER — EPHEDRINE 5 MG/ML INJ
10.0000 mg | INTRAVENOUS | Status: DC | PRN
  Administered 2020-05-10: 10 mg via INTRAVENOUS
  Filled 2020-05-10: qty 10

## 2020-05-10 MED ORDER — LACTATED RINGERS IV SOLN: INTRAVENOUS | Status: DC

## 2020-05-10 MED ORDER — OXYTOCIN BOLUS FROM INFUSION: 333.0000 mL | Freq: Once | INTRAVENOUS | Status: DC

## 2020-05-10 MED ORDER — TERBUTALINE SULFATE 1 MG/ML IJ SOLN: 0.2500 mg | Freq: Once | INTRAMUSCULAR | Status: DC | PRN

## 2020-05-10 MED ORDER — ACETAMINOPHEN 325 MG PO TABS: 650.0000 mg | ORAL_TABLET | ORAL | Status: DC | PRN

## 2020-05-10 MED ORDER — OXYTOCIN-SODIUM CHLORIDE 30-0.9 UT/500ML-% IV SOLN: 2.5000 [IU]/h | INTRAVENOUS | Status: DC

## 2020-05-10 MED ORDER — LACTATED RINGERS IV SOLN
500.0000 mL | INTRAVENOUS | Status: DC | PRN
  Administered 2020-05-10: 500 mL via INTRAVENOUS

## 2020-05-10 MED ORDER — MISOPROSTOL 25 MCG QUARTER TABLET
25.0000 ug | ORAL_TABLET | ORAL | Status: DC | PRN
  Administered 2020-05-10 (×2): 25 ug via VAGINAL
  Filled 2020-05-10 (×2): qty 1

## 2020-05-10 MED ORDER — FENTANYL-BUPIVACAINE-NACL 0.5-0.125-0.9 MG/250ML-% EP SOLN
12.0000 mL/h | EPIDURAL | Status: DC | PRN
  Filled 2020-05-10: qty 250

## 2020-05-10 MED ORDER — PHENYLEPHRINE 40 MCG/ML (10ML) SYRINGE FOR IV PUSH (FOR BLOOD PRESSURE SUPPORT)
80.0000 ug | PREFILLED_SYRINGE | INTRAVENOUS | Status: DC | PRN
  Administered 2020-05-10: 80 ug via INTRAVENOUS
  Filled 2020-05-10: qty 10

## 2020-05-10 NOTE — Progress Notes (Signed)
Patient ID: Kayla Campbell, female   DOB: Jul 11, 1991, 29 y.o.   MRN: [id number] Pt reports discomfort with contractions. +Fms VS: 127-131/67-70 GEN - NAD EFM - 135, occasional variables, + accels, cat 2 TOCO - irregular contractions q 1-22mins SVE - 1/50/-2  AP: Prime at 39 0/7wks with chtn currently controlled         - s/p cytotec x 2        - foley bulb placed with 60u/40v saline        - check prn       - encourage ambulation

## 2020-05-10 NOTE — Anesthesia Procedure Notes (Signed)
Epidural Patient location during procedure: OB Start time: 05/10/2020 6:10 PM End time: 05/10/2020 6:15 PM  Staffing Anesthesiologist: Bethena Midget, MD  Preanesthetic Checklist Completed: patient identified, IV checked, site marked, risks and benefits discussed, surgical consent, monitors and equipment checked, pre-op evaluation and timeout performed  Epidural Patient position: sitting Prep: DuraPrep and site prepped and draped Patient monitoring: continuous pulse ox and blood pressure Approach: midline Location: L1-L2 Injection technique: LOR air  Needle:  Needle type: Tuohy  Needle gauge: 17 G Needle length: 9 cm and 9 Needle insertion depth: 7 cm Catheter type: closed end flexible Catheter size: 19 Gauge Catheter at skin depth: 12 cm Test dose: negative  Assessment Events: blood not aspirated, injection not painful, no injection resistance, no paresthesia and negative IV test

## 2020-05-10 NOTE — Progress Notes (Signed)
Patient ID: Kayla Campbell, female   DOB: [birth year], 29 y.o.   MRN: [id number] Pt reports increasing pain with contractions. Declines epidural at this time VSS - 110/64 GEN - Mild distress with contractions EFM - 140s, cat 1 TOCO - ctxs irregular SVE - 6/90/-1  A/P: Pt progressing well in labor         AROM noted mild meconium          FSE and IUPC placed after explained purpose to pt and family         Continue with expectant mgmt         Anticipate svd         Epidural prn pt

## 2020-05-10 NOTE — Anesthesia Preprocedure Evaluation (Signed)
Anesthesia Evaluation  Patient identified by MRN, date of birth, ID band Patient awake    Reviewed: Allergy & Precautions, H&P , NPO status , Patient's Chart, lab work & pertinent test results, reviewed documented beta blocker date and time   Airway Mallampati: III  TM Distance: >3 FB Neck ROM: full    Dental no notable dental hx.    Pulmonary neg pulmonary ROS,    Pulmonary exam normal breath sounds clear to auscultation       Cardiovascular hypertension, Pt. on medications negative cardio ROS Normal cardiovascular exam Rhythm:regular Rate:Normal     Neuro/Psych negative neurological ROS  negative psych ROS   GI/Hepatic negative GI ROS, Neg liver ROS,   Endo/Other  Morbid obesity  Renal/GU negative Renal ROS  negative genitourinary   Musculoskeletal   Abdominal (+) + obese,   Peds  Hematology  (+) Blood dyscrasia, anemia ,   Anesthesia Other Findings   Reproductive/Obstetrics (+) Pregnancy                             Anesthesia Physical Anesthesia Plan  ASA: III  Anesthesia Plan: Epidural   Post-op Pain Management:    Induction:   PONV Risk Score and Plan:   Airway Management Planned:   Additional Equipment:   Intra-op Plan:   Post-operative Plan:   Informed Consent: I have reviewed the patients History and Physical, chart, labs and discussed the procedure including the risks, benefits and alternatives for the proposed anesthesia with the patient or authorized representative who has indicated his/her understanding and acceptance.       Plan Discussed with: Anesthesiologist  Anesthesia Plan Comments:         Anesthesia Quick Evaluation

## 2020-05-11 ENCOUNTER — Encounter (HOSPITAL_COMMUNITY)
Admission: RE | Disposition: A | Discharge status: Home / Self Care | Payer: Self-pay | Source: Home / Self Care | Attending: Obstetrics and Gynecology

## 2020-05-11 ENCOUNTER — Encounter (HOSPITAL_COMMUNITY): Payer: Self-pay | Admitting: Obstetrics and Gynecology

## 2020-05-11 LAB — CBC
HCT: 29.6 % — ABNORMAL LOW (ref 36.0–46.0)
Hemoglobin: 9.8 g/dL — ABNORMAL LOW (ref 12.0–15.0)
MCH: 26.6 pg (ref 26.0–34.0)
MCHC: 33.1 g/dL (ref 30.0–36.0)
MCV: 80.2 fL (ref 80.0–100.0)
Platelets: 262 10*3/uL (ref 150–400)
RBC: 3.69 MIL/uL — ABNORMAL LOW (ref 3.87–5.11)
RDW: 15.8 % — ABNORMAL HIGH (ref 11.5–15.5)
WBC: 14.5 10*3/uL — ABNORMAL HIGH (ref 4.0–10.5)
nRBC: 0 % (ref 0.0–0.2)

## 2020-05-11 LAB — CREATININE, SERUM
Creatinine, Ser: 0.93 mg/dL (ref 0.44–1.00)
GFR calc Af Amer: >60 mL/min (ref >60)
GFR calc non Af Amer: >60 mL/min (ref >60)

## 2020-05-11 SURGERY — Surgical Case
Anesthesia: Epidural

## 2020-05-11 MED ORDER — FENTANYL-BUPIVACAINE-NACL 0.5-0.125-0.9 MG/250ML-% EP SOLN: 12.0000 mL/h | EPIDURAL | Status: DC | PRN

## 2020-05-11 MED ORDER — SODIUM CHLORIDE 0.9 % IR SOLN
Status: DC | PRN
  Administered 2020-05-11: 1

## 2020-05-11 MED ORDER — ONDANSETRON HCL 4 MG/2ML IJ SOLN
INTRAMUSCULAR | Status: AC
  Filled 2020-05-11: qty 2

## 2020-05-11 MED ORDER — GENTAMICIN SULFATE 40 MG/ML IJ SOLN
5.0000 mg/kg | INTRAVENOUS | Status: DC
  Filled 2020-05-11: qty 17

## 2020-05-11 MED ORDER — KETOROLAC TROMETHAMINE 30 MG/ML IJ SOLN
30.0000 mg | Freq: Once | INTRAMUSCULAR | Status: AC
  Administered 2020-05-11: 30 mg via INTRAVENOUS

## 2020-05-11 MED ORDER — LIDOCAINE-EPINEPHRINE (PF) 2 %-1:200000 IJ SOLN
INTRAMUSCULAR | Status: DC | PRN
  Administered 2020-05-11 (×2): 5 mL via EPIDURAL

## 2020-05-11 MED ORDER — FENTANYL CITRATE (PF) 100 MCG/2ML IJ SOLN
INTRAMUSCULAR | Status: DC | PRN
  Administered 2020-05-11: 100 ug via EPIDURAL

## 2020-05-11 MED ORDER — COCONUT OIL OIL: 1.0000 "application " | TOPICAL_OIL | Status: DC | PRN

## 2020-05-11 MED ORDER — ZOLPIDEM TARTRATE 5 MG PO TABS: 5.0000 mg | ORAL_TABLET | Freq: Every evening | ORAL | Status: DC | PRN

## 2020-05-11 MED ORDER — DEXAMETHASONE SODIUM PHOSPHATE 4 MG/ML IJ SOLN
INTRAMUSCULAR | Status: DC | PRN
Start: 2020-05-11 — End: 2020-05-11
  Administered 2020-05-11: 4 mg via INTRAVENOUS

## 2020-05-11 MED ORDER — MORPHINE SULFATE (PF) 0.5 MG/ML IJ SOLN
INTRAMUSCULAR | Status: AC
  Filled 2020-05-11: qty 10

## 2020-05-11 MED ORDER — ONDANSETRON HCL 4 MG/2ML IJ SOLN
INTRAMUSCULAR | Status: DC | PRN
  Administered 2020-05-11: 4 mg via INTRAVENOUS

## 2020-05-11 MED ORDER — SIMETHICONE 80 MG PO CHEW
80.0000 mg | CHEWABLE_TABLET | Freq: Three times a day (TID) | ORAL | Status: DC
  Administered 2020-05-11 – 2020-05-12 (×4): 80 mg via ORAL
  Filled 2020-05-11 (×4): qty 1

## 2020-05-11 MED ORDER — PRENATAL MULTIVITAMIN CH
1.0000 | ORAL_TABLET | Freq: Every day | ORAL | Status: DC
  Administered 2020-05-11 – 2020-05-12 (×2): 1 via ORAL
  Filled 2020-05-11 (×2): qty 1

## 2020-05-11 MED ORDER — ONDANSETRON HCL 4 MG/2ML IJ SOLN: 4.0000 mg | Freq: Once | INTRAMUSCULAR | Status: DC | PRN

## 2020-05-11 MED ORDER — WITCH HAZEL-GLYCERIN EX PADS: 1.0000 "application " | MEDICATED_PAD | CUTANEOUS | Status: DC | PRN

## 2020-05-11 MED ORDER — OXYTOCIN-SODIUM CHLORIDE 30-0.9 UT/500ML-% IV SOLN: 2.5000 [IU]/h | INTRAVENOUS | Status: AC

## 2020-05-11 MED ORDER — MORPHINE SULFATE (PF) 0.5 MG/ML IJ SOLN
INTRAMUSCULAR | Status: DC | PRN
  Administered 2020-05-11: 3 mg via EPIDURAL

## 2020-05-11 MED ORDER — OXYCODONE HCL 5 MG PO TABS: 5.0000 mg | ORAL_TABLET | Freq: Once | ORAL | Status: DC | PRN

## 2020-05-11 MED ORDER — DIPHENHYDRAMINE HCL 25 MG PO CAPS
25.0000 mg | ORAL_CAPSULE | Freq: Four times a day (QID) | ORAL | Status: DC | PRN
  Administered 2020-05-11: 25 mg via ORAL
  Filled 2020-05-11: qty 1

## 2020-05-11 MED ORDER — SIMETHICONE 80 MG PO CHEW
80.0000 mg | CHEWABLE_TABLET | ORAL | Status: DC
  Administered 2020-05-12: 80 mg via ORAL
  Filled 2020-05-11: qty 1

## 2020-05-11 MED ORDER — LACTATED RINGERS IV SOLN: INTRAVENOUS | Status: DC

## 2020-05-11 MED ORDER — ACETAMINOPHEN 325 MG PO TABS: 325.0000 mg | ORAL_TABLET | ORAL | Status: DC | PRN

## 2020-05-11 MED ORDER — ACETAMINOPHEN 500 MG PO TABS
1000.0000 mg | ORAL_TABLET | Freq: Four times a day (QID) | ORAL | Status: DC
  Administered 2020-05-11 – 2020-05-12 (×6): 1000 mg via ORAL
  Filled 2020-05-11 (×6): qty 2

## 2020-05-11 MED ORDER — DIBUCAINE (PERIANAL) 1 % EX OINT: 1.0000 "application " | TOPICAL_OINTMENT | CUTANEOUS | Status: DC | PRN

## 2020-05-11 MED ORDER — LABETALOL HCL 100 MG PO TABS
100.0000 mg | ORAL_TABLET | Freq: Two times a day (BID) | ORAL | Status: DC
  Administered 2020-05-11 – 2020-05-12 (×3): 100 mg via ORAL
  Filled 2020-05-11 (×3): qty 1

## 2020-05-11 MED ORDER — FERROUS SULFATE 325 (65 FE) MG PO TABS
325.0000 mg | ORAL_TABLET | Freq: Two times a day (BID) | ORAL | Status: DC
  Administered 2020-05-11: 325 mg via ORAL
  Filled 2020-05-11: qty 1

## 2020-05-11 MED ORDER — ACETAMINOPHEN 160 MG/5ML PO SOLN: 325.0000 mg | ORAL | Status: DC | PRN

## 2020-05-11 MED ORDER — FENTANYL CITRATE (PF) 100 MCG/2ML IJ SOLN
INTRAMUSCULAR | Status: AC
  Filled 2020-05-11: qty 2

## 2020-05-11 MED ORDER — ENOXAPARIN SODIUM 80 MG/0.8ML ~~LOC~~ SOLN
70.0000 mg | SUBCUTANEOUS | Status: DC
  Administered 2020-05-11: 70 mg via SUBCUTANEOUS
  Filled 2020-05-11: qty 0.8

## 2020-05-11 MED ORDER — DOCUSATE SODIUM 100 MG PO CAPS
100.0000 mg | ORAL_CAPSULE | Freq: Two times a day (BID) | ORAL | Status: DC
  Administered 2020-05-11 – 2020-05-12 (×3): 100 mg via ORAL
  Filled 2020-05-11 (×3): qty 1

## 2020-05-11 MED ORDER — OXYTOCIN-SODIUM CHLORIDE 30-0.9 UT/500ML-% IV SOLN
INTRAVENOUS | Status: DC | PRN
Start: 2020-05-11 — End: 2020-05-11
  Administered 2020-05-11: 300 mL via INTRAVENOUS

## 2020-05-11 MED ORDER — MEPERIDINE HCL 25 MG/ML IJ SOLN: 6.2500 mg | INTRAMUSCULAR | Status: DC | PRN

## 2020-05-11 MED ORDER — OXYTOCIN-SODIUM CHLORIDE 30-0.9 UT/500ML-% IV SOLN
INTRAVENOUS | Status: AC
  Filled 2020-05-11: qty 500

## 2020-05-11 MED ORDER — OXYCODONE HCL 5 MG/5ML PO SOLN: 5.0000 mg | Freq: Once | ORAL | Status: DC | PRN

## 2020-05-11 MED ORDER — CLINDAMYCIN PHOSPHATE 900 MG/50ML IV SOLN: 900.0000 mg | INTRAVENOUS | Status: DC

## 2020-05-11 MED ORDER — DIPHENHYDRAMINE HCL 50 MG/ML IJ SOLN: 12.5000 mg | INTRAMUSCULAR | Status: DC | PRN

## 2020-05-11 MED ORDER — LIDOCAINE-EPINEPHRINE (PF) 2 %-1:200000 IJ SOLN
INTRAMUSCULAR | Status: AC
  Filled 2020-05-11: qty 10

## 2020-05-11 MED ORDER — IBUPROFEN 800 MG PO TABS
800.0000 mg | ORAL_TABLET | Freq: Three times a day (TID) | ORAL | Status: DC
  Administered 2020-05-11 – 2020-05-12 (×4): 800 mg via ORAL
  Filled 2020-05-11 (×4): qty 1

## 2020-05-11 MED ORDER — MENTHOL 3 MG MT LOZG: 1.0000 | LOZENGE | OROMUCOSAL | Status: DC | PRN

## 2020-05-11 MED ORDER — SENNOSIDES-DOCUSATE SODIUM 8.6-50 MG PO TABS
2.0000 | ORAL_TABLET | ORAL | Status: DC
  Administered 2020-05-12: 2 via ORAL
  Filled 2020-05-11: qty 2

## 2020-05-11 MED ORDER — SIMETHICONE 80 MG PO CHEW: 80.0000 mg | CHEWABLE_TABLET | ORAL | Status: DC | PRN

## 2020-05-11 MED ORDER — DEXTROSE 5 % IV SOLN
INTRAVENOUS | Status: AC
  Filled 2020-05-11: qty 3000

## 2020-05-11 MED ORDER — TETANUS-DIPHTH-ACELL PERTUSSIS 5-2.5-18.5 LF-MCG/0.5 IM SUSP: 0.5000 mL | Freq: Once | INTRAMUSCULAR | Status: DC

## 2020-05-11 MED ORDER — DEXTROSE 5 % IV SOLN
INTRAVENOUS | Status: DC | PRN
  Administered 2020-05-11: 3 g via INTRAVENOUS

## 2020-05-11 MED ORDER — OXYCODONE HCL 5 MG PO TABS: 5.0000 mg | ORAL_TABLET | ORAL | Status: DC | PRN

## 2020-05-11 MED ORDER — KETOROLAC TROMETHAMINE 30 MG/ML IJ SOLN
INTRAMUSCULAR | Status: AC
  Filled 2020-05-11: qty 1

## 2020-05-11 MED ORDER — DEXAMETHASONE SODIUM PHOSPHATE 4 MG/ML IJ SOLN
INTRAMUSCULAR | Status: AC
  Filled 2020-05-11: qty 1

## 2020-05-11 MED ORDER — FENTANYL CITRATE (PF) 100 MCG/2ML IJ SOLN: 25.0000 ug | INTRAMUSCULAR | Status: DC | PRN

## 2020-05-11 SURGICAL SUPPLY — 40 items
BENZOIN TINCTURE PRP APPL 2/3 (GAUZE/BANDAGES/DRESSINGS) ×3 IMPLANT
CHLORAPREP W/TINT 26ML (MISCELLANEOUS) ×3 IMPLANT
CLAMP CORD UMBIL (MISCELLANEOUS) IMPLANT
CLOSURE STERI STRIP 1/2 X4 (GAUZE/BANDAGES/DRESSINGS) ×3 IMPLANT
CLOSURE WOUND 1/2 X4 (GAUZE/BANDAGES/DRESSINGS)
CLOTH BEACON ORANGE TIMEOUT ST (SAFETY) ×3 IMPLANT
DRAPE C SECTION CLR SCREEN (DRAPES) ×3 IMPLANT
DRSG OPSITE POSTOP 4X10 (GAUZE/BANDAGES/DRESSINGS) ×3 IMPLANT
ELECT REM PT RETURN 9FT ADLT (ELECTROSURGICAL) ×3
ELECTRODE REM PT RTRN 9FT ADLT (ELECTROSURGICAL) ×1 IMPLANT
EXTRACTOR VACUUM KIWI (MISCELLANEOUS) IMPLANT
GLOVE BIO SURGEON STRL SZ 6.5 (GLOVE) ×2 IMPLANT
GLOVE BIO SURGEONS STRL SZ 6.5 (GLOVE) ×1
GLOVE BIOGEL PI IND STRL 7.0 (GLOVE) ×2 IMPLANT
GLOVE BIOGEL PI INDICATOR 7.0 (GLOVE) ×4
GOWN STRL REUS W/TWL LRG LVL3 (GOWN DISPOSABLE) ×6 IMPLANT
HOVERMATT SINGLE USE (MISCELLANEOUS) ×3 IMPLANT
KIT ABG SYR 3ML LUER SLIP (SYRINGE) IMPLANT
NEEDLE HYPO 25X5/8 SAFETYGLIDE (NEEDLE) IMPLANT
NS IRRIG 1000ML POUR BTL (IV SOLUTION) ×3 IMPLANT
PACK C SECTION WH (CUSTOM PROCEDURE TRAY) ×3 IMPLANT
PAD OB MATERNITY 4.3X12.25 (PERSONAL CARE ITEMS) ×3 IMPLANT
RETAINER VISCERAL (MISCELLANEOUS) ×3 IMPLANT
RETRACTOR WND ALEXIS 25 LRG (MISCELLANEOUS) ×1 IMPLANT
RTRCTR C-SECT PINK 25CM LRG (MISCELLANEOUS) IMPLANT
RTRCTR WOUND ALEXIS 25CM LRG (MISCELLANEOUS) ×3
STRIP CLOSURE SKIN 1/2X4 (GAUZE/BANDAGES/DRESSINGS) IMPLANT
SUT CHROMIC 1 CTX 36 (SUTURE) ×6 IMPLANT
SUT PLAIN 0 NONE (SUTURE) IMPLANT
SUT PLAIN 2 0 XLH (SUTURE) ×3 IMPLANT
SUT VIC AB 0 CT1 27 (SUTURE) ×4
SUT VIC AB 0 CT1 27XBRD ANBCTR (SUTURE) ×2 IMPLANT
SUT VIC AB 2-0 CT1 27 (SUTURE) ×2
SUT VIC AB 2-0 CT1 TAPERPNT 27 (SUTURE) ×1 IMPLANT
SUT VIC AB 3-0 CT1 27 (SUTURE)
SUT VIC AB 3-0 CT1 TAPERPNT 27 (SUTURE) IMPLANT
SUT VIC AB 4-0 KS 27 (SUTURE) ×3 IMPLANT
TOWEL OR 17X24 6PK STRL BLUE (TOWEL DISPOSABLE) ×3 IMPLANT
TRAY FOLEY W/BAG SLVR 14FR LF (SET/KITS/TRAYS/PACK) ×3 IMPLANT
WATER STERILE IRR 1000ML POUR (IV SOLUTION) ×3 IMPLANT

## 2020-05-11 NOTE — Lactation Note (Signed)
This note was copied from a baby's chart. Lactation Consultation Note Baby 3 hrs old. Mom needs latching assistance d/t immobility. Mom was c/section w/IV. Large pendulous breast w/flat nipple at the bottom of breast makes it difficult for mom to see nipple for latching. Encouraged football hold. Nipples compressible at this time. Nipples need to be sand which held or t/cupped into baby's mouth. Mom latching to shallow baby can't feel nipple in mouth. LC repositioned mom more up right, back pillow support. Pillows and props to bring baby to breast. Rolled wash cloth under breast for firming support. Baby will latch well, suckle for a few times then off or falls asleep.  LC demonstrated touching top lip to get baby to open wide then pushing baby onto breast. LC sandwhich nipple into mouth. Noted dimpling. Fed more into mouth. LC feels baby maybe tongue thrusting some. No swallows heard but seen.  Newborn feeding habits, STS, I&O, breast massage while feeding, milk storage, supply and demand discussed.  Shells given, encouraged to wear today. Hand pump given for pre-pumping to evert nipples more before latching.  Easily hand express colostrum. RN gave baby colostrum via spoon.  Mom will need help w/latching until she figures out how to do it and hopefully more mobile soon.  Patient Name: Kayla Campbell BULAG'T Date: 05/11/2020 Reason for consult: Initial assessment;Primapara;Term   Maternal Data Has patient been taught Hand Expression?: Yes Does the patient have breastfeeding experience prior to this delivery?: No  Feeding Feeding Type: Breast Fed  LATCH Score Latch: Grasps breast easily, tongue down, lips flanged, rhythmical sucking.  Audible Swallowing: A few with stimulation  Type of Nipple: Flat  Comfort (Breast/Nipple): Soft / non-tender  Hold (Positioning): Full assist, staff holds infant at breast  LATCH Score: 6  Interventions Interventions: Breast feeding  basics reviewed;Support pillows;Assisted with latch;Position options;Skin to skin;Expressed milk;Breast massage;Hand express;Shells;Pre-pump if needed;Hand pump;Breast compression;Adjust position  Lactation Tools Discussed/Used Tools: Pump;Shells Shell Type: Inverted Breast pump type: Manual WIC Program: Yes Pump Review: Setup, frequency, and cleaning;Milk Storage Initiated by:: Peri Jefferson RN IBCLC Date initiated:: 05/11/20   Consult Status Consult Status: Follow-up Date: 05/11/20 Follow-up type: In-patient    Charyl Dancer 05/11/2020, 4:58 AM

## 2020-05-11 NOTE — Op Note (Signed)
Operative Note    Preoperative Diagnosis: IUP at 39 2/7wks                                             Fetal intolerance of labor                                            Arrest of dilation                                            Chronic hypertension   Postoperative Diagnosis: Same   Procedure: Primary low transverse cesarean section with double layered closure   Surgeon: Mickle Mallory DO Assist: Valentino Nose   Anesthesia :Epidural  Fluids: 1569ml EBL: 549ml UOP: 264ml  Findings: Viable female infant in vertex position, APGARS 8,9; weight pending.  Grossly normal uterus, tubes and ovaries   Specimen: Placenta to pathology  Indication: Pt labored for about 12hrs with bouts of recurrent decelerations preventing effective augmentation of labor with pitocin. Pt stayed at 6cm for 10hrs with no cervical change due to inadequate mvus. Pt was counseled and offered primary cesarean section for delivery. After conferring with family, pt agreed. Risks and benefits of procedure and expectations were discussed fully. Consent was verified   Procedure Note Patient was taken to the operating room where epidural anesthesia was tested and found to be adequate. She was placed in the dorsal supine position with a leftward tilt. A foley was placed  in a sterile manner to drain the bladder.  Pt was prepped and draped in the usual sterile fashion. An appropriate time out was performed. Allis clamp test confirmed adequate anesthesia. A Pfannenstiel skin incision was then made with the scalpel and carried through to the underlying layer of fascia by sharp dissection and Bovie cautery. The fascia was nicked in the midline and the incision was extended laterally with Mayo scissors. The superior, then inferior, aspects of the incision were grasped with kocher clamps and dissected off the underlying rectus muscles. Rectus muscles were separated in the midline and the peritoneal cavity entered bluntly. The  peritoneal incision was then extended both superiorly and inferiorly with careful attention to avoid both bowel and bladder. The Alexis self-retaining wound retractor was then placed within the incision and the lower uterine segment exposed. The bladder flap was developed with Metzenbaum scissors and pushed away from the lower uterine segment. The lower uterine segment was then incised in a transverse fashion and the cavity itself entered bluntly.The vertex was then gently returned  into the pelvic cavity,  lifted and delivered from the incision without difficulty. A loose nuchal was reduced over infants head.  The remainder of the infant delivered easily. Bulb suction of mouth and nose was performed.  After a minute delay, the cord was clamped and cut. The infant was handed off to the waiting NICU team.COrd blood and gas were obtained. The placenta was then spontaneously expressed from the uterus and the uterus cleared of all clots and debris with moist lap sponge. The uterine incision was then repaired in 2 layers:  the first layer was a running locked layer 0 chromic and the  second an imbricating layer of the same suture. The tubes and ovaries were inspected and the gutters cleared of all clots and debris. The uterine incision was inspected again and found to be hemostatic. All instruments and sponges as well as the Alexis retractor were then removed from the abdomen. The  peritoneum was then sutured in a running fashion with the rectus muscles included using 2-0 vicryl. . The fascia was then closed with 0 Vicryl in a running fashion. Subcutaneous tissue was reapproximated with 3-0 plain in a interrupted sutures . The skin was closed with a subcuticular stitch of 4-0 Vicryl on a Keith needle and then reinforced with benzoin and Steri-Strips. At the conclusion of the procedure all instruments and sponge counts were correct. Patient was taken to the recovery room in good condition with her baby accompanying her  skin to skin.

## 2020-05-11 NOTE — Anesthesia Postprocedure Evaluation (Signed)
Anesthesia Post Note  Patient: Kayla Campbell  Procedure(s) Performed: CESAREAN SECTION (N/A )     Patient location during evaluation: Mother Baby Anesthesia Type: Epidural Level of consciousness: awake and alert Pain management: pain level controlled Vital Signs Assessment: post-procedure vital signs reviewed and stable Respiratory status: spontaneous breathing, nonlabored ventilation and respiratory function stable Cardiovascular status: stable Postop Assessment: no headache, no backache and epidural receding Anesthetic complications: no   No complications documented.  Last Vitals:  Vitals:   05/11/20 1132 05/11/20 1700  BP:  121/63  Pulse:  (!) 102  Resp:  20  Temp:  36.7 C  SpO2: 100% 99%    Last Pain:  Vitals:   05/11/20 1730  TempSrc:   PainSc: 0-No pain   Pain Goal:                   Kayla Campbell

## 2020-05-11 NOTE — Plan of Care (Signed)
L&D careplan completed 

## 2020-05-11 NOTE — Progress Notes (Signed)
Patient ID: Kayla Campbell, female   DOB: Apr 04, 1991, 29 y.o.   MRN: [id number] Pt was monitored over the past few hours closely. Fetal strip was intermittently noted to be cat 2 with recurrent late decels but also spontaneous accels. Position changes were frequently enough to resolve strip for periods of time. At 2122pm, pitocin was finally able to safely be started. Pt tolerated reasonably well. MVUs were at 120-140. Pitocin was finally increased to and late decels were again noted to resume.  On exam, cervix remained unchanged at 6cm thus remote from delivery.   Given fetal apparent intolerance of pitocin, inadequate mvus, prolonged labor due to arrest of dilation, I recommended to pt and family that they consider delivery via primary cesarean section.  I reviewed the procedure with its likely risks and benefits and answered all questions.  Pt and family conferred and agreed with plan to deliver via cesarean section  Stop pitocin NPO Ancef 3gms IV preop SCDs   To OR when ready

## 2020-05-11 NOTE — Progress Notes (Signed)
POSTPARTUM POSTOP PROGRESS NOTE  POD #0  Subjective:  No acute events overnight.  Patient has not yet ambulated or voided given recent section, Foley still in place as well as SCDs.  She denies nausea or vomiting.  Pain is well controlled.  She has not had flatus. She has not had bowel movement.  Lochia Minimal. Denies PreE symptoms. Does desire circ for son who is cleared by Peds in room. Reviewed timing of section, will likely be tomorrow  Objective: Blood pressure 138/68, pulse (!) 108, temperature 98.3 F (36.8 C), temperature source Oral, resp. rate 18, height 5\' 1"  (1.549 m), weight (!) 136.2 kg, last menstrual period 05/26/2019, SpO2 99 %, unknown if currently breastfeeding.  Physical Exam:  General: alert, cooperative and no distress. Morbidly obese Lochia:normal flow Chest: CTAB Heart: RRR no m/r/g Abdomen: +BS, soft, appropriately tender POD#0 Uterine Fundus: firm. Pressure dressing intact, difficult to asses location given habitus Extremities: 3+ edema (per signout, unchanged from preop). SCDs in place, no palpable cords  Recent Labs    05/10/20 1210 05/11/20 0511  HGB 10.9* 9.8*  HCT 32.9* 29.6*    Assessment/Plan:  ASSESSMENT: Kayla Campbell is a 29 y.o. G1P1001 s/p PLTCS @ [redacted]w[redacted]d for fetal intolerance to labor. PNC c/b CHTN on meds, morbid obesity (BMI 57), anemia, RNI.   Plan for discharge tomorrow, Breastfeeding, Lactation consult and Circumcision prior to discharge  CHTN  - Bps WNL, continue to monitor, labetalol 100mg  BID Anemia - asx but has not yet ambulated. Taking good PO, will initiate iron/colace for anemia 2/2 acute blood loss.    LOS: 1 day

## 2020-05-11 NOTE — Transfer of Care (Signed)
Immediate Anesthesia Transfer of Care Note  Patient: Kayla Campbell  Procedure(s) Performed: CESAREAN SECTION (N/A )  Patient Location: PACU  Anesthesia Type:Epidural  Level of Consciousness: awake, alert  and oriented  Airway & Oxygen Therapy: Patient Spontanous Breathing  Post-op Assessment: Report given to RN and Post -op Vital signs reviewed and stable  Post vital signs: Reviewed and stable  Last Vitals:  Vitals Value Taken Time  BP 119/69 05/11/20 0226  Temp    Pulse 114 05/11/20 0229  Resp 21 05/11/20 0229  SpO2 100 % 05/11/20 0229  Vitals shown include unvalidated device data.  Last Pain:  Vitals:   05/10/20 2123  TempSrc: Oral  PainSc:          Complications: No complications documented.

## 2020-05-11 NOTE — Lactation Note (Signed)
This note was copied from a baby's chart. Lactation Consultation Note  Patient Name: Kayla Campbell Date: 05/11/2020 Reason for consult: Follow-up assessment;Difficult latch;Primapara;1st time breastfeeding;Term  First-time mother eager to breastfeed. Mother reports difficulty latching baby due to flat nipples. Mother shared concerns about baby not having enough. Mother explained she has been trying to use the NP and the manual pump to help with latching but she feels it is not working. Set up DEBP for stimulation purposes because mother is using a NS. Mother reports her family would like to help feeding baby with MBM. Provided education regarding frequency, cleaning and milk storage. Encouraged to pump every time on initiation mode every time she uses the NS and feed baby everything she pumps.   Mother is willing to try at this time since baby started showing cues. Assisted with hand-expression and colostrum is readily expressed. Latched baby on left breast on football hold using the 20 mm nipple shield. Baby latched with difficulty but was able to succeed after a few attempts. Assisted mother with positioning and pillows to attain a deeper latch. Observed breast tissue movement, rhythmic suckling and swallows. After 20 minutes, mother expressed interest to transition to football hold to right breast. Evidence of colostrum on NS. Mother reports a good latch with nipple shield and baby was able to sustain a deep latch. Identified some swallows. Baby was on right breast for 7 minutes and was actively swallowing when LC left the room.   Encourage to follow hunger cues to feed baby. Reviewed breastfeeding basics. Discussed milk coming to volume. Reviewed NBN behavior and second day expectations and encouraged to contact [facility] for support as needed and recommended to request help for questions or concerns.    All questions answered at this time.   Maternal Data Has patient been taught Hand  Expression?: Yes Does the patient have breastfeeding experience prior to this delivery?: No  Feeding Feeding Type: Breast Fed  LATCH Score Latch: Repeated attempts needed to sustain latch, nipple held in mouth throughout feeding, stimulation needed to elicit sucking reflex.  Audible Swallowing: A few with stimulation  Type of Nipple: Flat  Comfort (Breast/Nipple): Soft / non-tender  Hold (Positioning): Assistance needed to correctly position infant at breast and maintain latch.  LATCH Score: 6  Interventions Interventions: Breast feeding basics reviewed;Assisted with latch;Skin to skin;Breast massage;Hand express;Adjust position;Support pillows;Position options;Expressed milk;Shells;DEBP;Hand pump  Lactation Tools Discussed/Used Tools: Shells;Pump;Nipple Shields Nipple shield size: 20 Shell Type:  (flat) Breast pump type: Double-Electric Breast Pump WIC Program: Yes Pump Review: Setup, frequency, and cleaning;Milk Storage Initiated by:: Kayla Campbell IBCLC Date initiated:: 05/11/20   Consult Status Consult Status: Follow-up Date: 05/12/20 Follow-up type: In-patient    Kayla Campbell A Higuera Ancidey 05/11/2020, 5:33 PM

## 2020-05-12 MED ORDER — IBUPROFEN 800 MG PO TABS: 800.0000 mg | ORAL_TABLET | Freq: Three times a day (TID) | ORAL | 0 refills | Status: AC

## 2020-05-12 MED ORDER — OXYCODONE HCL 5 MG PO TABS: 5.0000 mg | ORAL_TABLET | ORAL | 0 refills | Status: AC | PRN

## 2020-05-12 MED ORDER — FERROUS SULFATE 325 (65 FE) MG PO TABS
325.0000 mg | ORAL_TABLET | Freq: Every day | ORAL | Status: DC
  Administered 2020-05-12: 325 mg via ORAL
  Filled 2020-05-12: qty 1

## 2020-05-12 NOTE — Discharge Instructions (Signed)
As per discharge pamphlet °

## 2020-05-12 NOTE — Progress Notes (Signed)
POD #2 LTCS Doing well, some pain, wants to go home today Afeb, VSS, BP controlled Abd-soft, fundus firm, incision intact D/c home today

## 2020-05-12 NOTE — Discharge Summary (Signed)
Postpartum Discharge Summary      Patient Name: Breanah Faddis DOB: [birth year] MRN: [id number]  Date of admission: 05/10/2020 Delivery date:05/11/2020  Delivering provider: Carlynn Purl [facility]  Date of discharge: 05/12/2020  Admitting diagnosis: Chronic hypertension affecting pregnancy [O10.919] Postpartum care following cesarean delivery [Z39.2] Intrauterine pregnancy: [redacted]w[redacted]d     Secondary diagnosis:  Active Problems:   Chronic hypertension affecting pregnancy   Postpartum care following cesarean delivery     Discharge diagnosis: Term Pregnancy Delivered, CHTN and arrest of dilation, fetal intolerance of labor                                              Hospital course: Induction of Labor With Cesarean Section   29 y.o. yo G1P1001 at [redacted]w[redacted]d was admitted to the hospital 05/10/2020 for induction of labor. Patient had a labor course significant for protracted active phase and FHR decels. The patient went for cesarean section due to Arrest of Dilation and fetal intolerance of labor. Delivery details are as follows: Membrane Rupture Time/Date: 1:24 PM ,05/10/2020   Delivery Method:C-Section, Low Transverse  Details of operation can be found in separate operative Note.  Patient had an uncomplicated postpartum course. BP remained controlled on Labetalol 100 mg po bid.  She is ambulating, tolerating a regular diet, passing flatus, and urinating well.  Patient is discharged home in stable condition on 05/12/20.      Newborn Data: Birth date:05/11/2020  Birth time:1:34 AM  Gender:Female  Living status:Living  Apgars:8 ,9  Weight:3320 g                                Physical exam  Vitals:   05/11/20 1132 05/11/20 1700 05/11/20 2219 05/12/20 0643  BP:  121/63 (!) 150/77 (!) 137/58  Pulse:  (!) 102 (!) 101 (!) 105  Resp:  20 20 20   Temp:  98.1 F (36.7 C) 99.1 F (37.3 C) 99.1 F (37.3 C)  TempSrc:  Axillary Oral Oral  SpO2: 100% 99%  97%  Weight:      Height:       General:  alert Lochia: appropriate Uterine Fundus: firm Incision: Healing well with no significant drainage  Labs: Lab Results  Component Value Date   WBC 14.5 (H) 05/11/2020   HGB 9.8 (L) 05/11/2020   HCT 29.6 (L) 05/11/2020   MCV 80.2 05/11/2020   PLT 262 05/11/2020   CMP Latest Ref Rng & Units 05/11/2020  Glucose 70 - 99 mg/dL -  BUN 6 - 20 mg/dL -  Creatinine 0.44 - 1.00 mg/dL 0.93  Sodium 135 - 145 mmol/L -  Potassium 3.5 - 5.1 mmol/L -  Chloride 98 - 111 mmol/L -  CO2 22 - 32 mmol/L -  Calcium 8.9 - 10.3 mg/dL -  Total Protein 6.5 - 8.1 g/dL -  Total Bilirubin 0.3 - 1.2 mg/dL -  Alkaline Phos 38 - 126 U/L -  AST 15 - 41 U/L -  ALT 0 - 44 U/L -   Edinburgh Score: No flowsheet data found.    After visit meds:  Allergies as of 05/12/2020      Reactions   Penicillins Other (See Comments)   childhood   Smz-tmp Ds [sulfamethoxazole-trimethoprim] Hives      Medication List    STOP taking  these medications   aspirin 81 MG chewable tablet   omeprazole 20 MG capsule Commonly known as: PRILOSEC   ondansetron 4 MG tablet Commonly known as: ZOFRAN     TAKE these medications   ferrous sulfate 325 (65 FE) MG tablet Take 325 mg by mouth daily with breakfast. Unsure of which medication--iron pill   FLINTSTONES GUMMIES PLUS PO Take 1 each by mouth daily.   ibuprofen 800 MG tablet Commonly known as: ADVIL Take 1 tablet (800 mg total) by mouth every 8 (eight) hours.   labetalol 100 MG tablet Commonly known as: NORMODYNE Take 100 mg by mouth 2 (two) times daily.   oxyCODONE 5 MG immediate release tablet Commonly known as: Oxy IR/ROXICODONE Take 1 tablet (5 mg total) by mouth every 4 (four) hours as needed for severe pain.        Discharge home in stable condition Infant Feeding: Breast Infant Disposition:home with mother Discharge instruction: per After Visit Summary and Postpartum booklet. Activity: Advance as tolerated. Pelvic rest for 6 weeks.  Diet: routine  diet  Postpartum Appointment:5 days  Future Appointments:No future appointments. Follow up Visit:  Follow-up Information    Edwinna Areola, DO. Schedule an appointment as soon as possible for a visit.   Specialty: Obstetrics and Gynecology Why: for BP check Contact information: [address] [place] [address] [place] [address] [phone]                   05/12/2020 Zenaida Niece, MD

## 2020-05-12 NOTE — Lactation Note (Signed)
This note was copied from a baby's chart. Lactation Consultation Note  Patient Name: Boy Janissa Bertram [id number] Date: 05/12/2020 Reason for consult: Follow-up assessment  P1 mother whose infant is now 66 hours old.  This is a term baby at 39+1 weeks.  Mother is being discharged today.  She is very excited to be going home.  Mother had no questions related to breast feeding.  She has been using a NS to assist with latching.  Mother informed me that this has been going well now.  I asked her to continue post pumping for 15 minutes after every feeding until her milk transitions and baby is feeding well.  Mother verbalized understanding.  She recently pumped 10 mls of EBM.  Provided nipples for feeding per mother's request.  Discussed the impact that baby's circumcision may have on his willingness to awaken and breast feed well.  Suggestions provided for waking him and feeding after circumcision.  He may want to feed often tonight and mother aware.  Engorgement prevention/treatment discussed.   She has a manual pump and a DEBP for home use.  She also has our OP phone number for questions after discharge.  Father and support person present.  RN updated.   Maternal Data    Feeding    LATCH Score                   Interventions    Lactation Tools Discussed/Used     Consult Status Consult Status: Complete Date: 05/12/20 Follow-up type: Call as needed    Youssouf Shipley R Ajay Strubel 05/12/2020, 12:03 PM

## 2020-05-12 NOTE — Lactation Note (Signed)
This note was copied from a baby's chart. Lactation Consultation Note  Patient Name: Kayla Campbell KQASU'O Date: 05/12/2020 Reason for consult: Follow-up assessment   LC Follow Up Visit:  Attempted to visit with mother, however, she had a "Do Not Disturb" sign on her door; will return later today.   Maternal Data    Feeding    LATCH Score                   Interventions    Lactation Tools Discussed/Used     Consult Status Consult Status: Follow-up Date: 05/12/20 Follow-up type: Call as needed    Irene Pap [place] 05/12/2020, 9:34 AM

## 2020-05-13 LAB — SURGICAL PATHOLOGY

## 2020-05-16 ENCOUNTER — Other Ambulatory Visit: Payer: Self-pay

## 2020-05-16 ENCOUNTER — Inpatient Hospital Stay (HOSPITAL_COMMUNITY)
Admission: EM | Admit: 2020-05-16 | Discharge: 2020-05-17 | DRG: 776 | Disposition: A | Discharge status: Home / Self Care | Payer: 59 | Attending: Obstetrics and Gynecology | Admitting: Obstetrics and Gynecology

## 2020-05-16 ENCOUNTER — Emergency Department (HOSPITAL_COMMUNITY): Payer: 59

## 2020-05-16 ENCOUNTER — Inpatient Hospital Stay (HOSPITAL_COMMUNITY): Payer: 59

## 2020-05-16 ENCOUNTER — Encounter (HOSPITAL_COMMUNITY): Payer: Self-pay | Admitting: Obstetrics and Gynecology

## 2020-05-16 DIAGNOSIS — I10 Essential (primary) hypertension: Secondary | ICD-10-CM

## 2020-05-16 DIAGNOSIS — O1003 Pre-existing essential hypertension complicating the puerperium: Secondary | ICD-10-CM | POA: Diagnosis present

## 2020-05-16 DIAGNOSIS — O99893 Other specified diseases and conditions complicating puerperium: Secondary | ICD-10-CM | POA: Diagnosis present

## 2020-05-16 DIAGNOSIS — R609 Edema, unspecified: Secondary | ICD-10-CM

## 2020-05-16 DIAGNOSIS — J811 Chronic pulmonary edema: Secondary | ICD-10-CM | POA: Diagnosis present

## 2020-05-16 DIAGNOSIS — R079 Chest pain, unspecified: Secondary | ICD-10-CM | POA: Diagnosis not present

## 2020-05-16 DIAGNOSIS — R778 Other specified abnormalities of plasma proteins: Secondary | ICD-10-CM | POA: Diagnosis present

## 2020-05-16 DIAGNOSIS — O1205 Gestational edema, complicating the puerperium: Secondary | ICD-10-CM | POA: Diagnosis not present

## 2020-05-16 LAB — PROTEIN / CREATININE RATIO, URINE
Creatinine, Urine: 44.61 mg/dL
Protein Creatinine Ratio: 0.22 mg/mg{Cre} — ABNORMAL HIGH (ref 0.00–0.15)
Total Protein, Urine: 10 mg/dL

## 2020-05-16 LAB — URINALYSIS, ROUTINE W REFLEX MICROSCOPIC
Bilirubin Urine: NEGATIVE
Glucose, UA: NEGATIVE mg/dL
Ketones, ur: NEGATIVE mg/dL
Nitrite: NEGATIVE
Protein, ur: NEGATIVE mg/dL
Specific Gravity, Urine: 1.008 (ref 1.005–1.030)
pH: 7 (ref 5.0–8.0)

## 2020-05-16 LAB — HEPATIC FUNCTION PANEL
ALT: 22 U/L (ref 0–44)
AST: 27 U/L (ref 15–41)
Albumin: 2.6 g/dL — ABNORMAL LOW (ref 3.5–5.0)
Alkaline Phosphatase: 82 U/L (ref 38–126)
Bilirubin, Direct: <0.1 mg/dL (ref 0.0–0.2)
Total Bilirubin: 0.4 mg/dL (ref 0.3–1.2)
Total Protein: 6.7 g/dL (ref 6.5–8.1)

## 2020-05-16 LAB — I-STAT BETA HCG BLOOD, ED (MC, WL, AP ONLY): I-stat hCG, quantitative: 27.9 m[IU]/mL — ABNORMAL HIGH (ref <5)

## 2020-05-16 LAB — BASIC METABOLIC PANEL
Anion gap: 10 (ref 5–15)
BUN: 10 mg/dL (ref 6–20)
CO2: 24 mmol/L (ref 22–32)
Calcium: 9.2 mg/dL (ref 8.9–10.3)
Chloride: 104 mmol/L (ref 98–111)
Creatinine, Ser: 1.07 mg/dL — ABNORMAL HIGH (ref 0.44–1.00)
GFR calc Af Amer: >60 mL/min (ref >60)
GFR calc non Af Amer: >60 mL/min (ref >60)
Glucose, Bld: 102 mg/dL — ABNORMAL HIGH (ref 70–99)
Potassium: 4.1 mmol/L (ref 3.5–5.1)
Sodium: 138 mmol/L (ref 135–145)

## 2020-05-16 LAB — CBC
HCT: 24.7 % — ABNORMAL LOW (ref 36.0–46.0)
Hemoglobin: 8.1 g/dL — ABNORMAL LOW (ref 12.0–15.0)
MCH: 26.4 pg (ref 26.0–34.0)
MCHC: 32.8 g/dL (ref 30.0–36.0)
MCV: 80.5 fL (ref 80.0–100.0)
Platelets: 316 10*3/uL (ref 150–400)
RBC: 3.07 MIL/uL — ABNORMAL LOW (ref 3.87–5.11)
RDW: 15.5 % (ref 11.5–15.5)
WBC: 10.3 10*3/uL (ref 4.0–10.5)
nRBC: 0.2 % (ref 0.0–0.2)

## 2020-05-16 LAB — TYPE AND SCREEN
ABO/RH(D): A POS
Antibody Screen: NEGATIVE

## 2020-05-16 LAB — TROPONIN I (HIGH SENSITIVITY)
Troponin I (High Sensitivity): 22 ng/L — ABNORMAL HIGH (ref <18)
Troponin I (High Sensitivity): 22 ng/L — ABNORMAL HIGH (ref <18)
Troponin I (High Sensitivity): 27 ng/L — ABNORMAL HIGH (ref <18)

## 2020-05-16 LAB — BRAIN NATRIURETIC PEPTIDE: B Natriuretic Peptide: 173.6 pg/mL — ABNORMAL HIGH (ref 0.0–100.0)

## 2020-05-16 MED ORDER — SODIUM CHLORIDE 0.9% FLUSH: 3.0000 mL | INTRAVENOUS | Status: DC | PRN

## 2020-05-16 MED ORDER — LABETALOL HCL 5 MG/ML IV SOLN: 80.0000 mg | INTRAVENOUS | Status: DC | PRN

## 2020-05-16 MED ORDER — ENOXAPARIN SODIUM 40 MG/0.4ML ~~LOC~~ SOLN
40.0000 mg | Freq: Every day | SUBCUTANEOUS | Status: DC
  Administered 2020-05-16 – 2020-05-17 (×2): 40 mg via SUBCUTANEOUS
  Filled 2020-05-16 (×2): qty 0.4

## 2020-05-16 MED ORDER — OXYCODONE-ACETAMINOPHEN 5-325 MG PO TABS
2.0000 | ORAL_TABLET | Freq: Once | ORAL | Status: AC
  Administered 2020-05-16: 2 via ORAL
  Filled 2020-05-16: qty 2

## 2020-05-16 MED ORDER — FUROSEMIDE 10 MG/ML IJ SOLN
40.0000 mg | Freq: Once | INTRAMUSCULAR | Status: AC
  Administered 2020-05-16: 40 mg via INTRAVENOUS
  Filled 2020-05-16: qty 4

## 2020-05-16 MED ORDER — LABETALOL HCL 5 MG/ML IV SOLN: 40.0000 mg | INTRAVENOUS | Status: DC | PRN

## 2020-05-16 MED ORDER — LABETALOL HCL 100 MG PO TABS
200.0000 mg | ORAL_TABLET | Freq: Once | ORAL | Status: AC
  Administered 2020-05-16: 200 mg via ORAL
  Filled 2020-05-16: qty 2

## 2020-05-16 MED ORDER — DOCUSATE SODIUM 100 MG PO CAPS
100.0000 mg | ORAL_CAPSULE | Freq: Every day | ORAL | Status: DC
  Administered 2020-05-16 – 2020-05-17 (×2): 100 mg via ORAL
  Filled 2020-05-16 (×2): qty 1

## 2020-05-16 MED ORDER — FUROSEMIDE 10 MG/ML IJ SOLN
20.0000 mg | Freq: Three times a day (TID) | INTRAMUSCULAR | Status: DC
  Administered 2020-05-17 (×2): 20 mg via INTRAVENOUS
  Filled 2020-05-16 (×2): qty 2

## 2020-05-16 MED ORDER — IBUPROFEN 600 MG PO TABS
600.0000 mg | ORAL_TABLET | Freq: Four times a day (QID) | ORAL | Status: DC | PRN
  Administered 2020-05-16 – 2020-05-17 (×3): 600 mg via ORAL
  Filled 2020-05-16 (×3): qty 1

## 2020-05-16 MED ORDER — LABETALOL HCL 200 MG PO TABS
200.0000 mg | ORAL_TABLET | Freq: Two times a day (BID) | ORAL | Status: DC
  Administered 2020-05-16 – 2020-05-17 (×2): 200 mg via ORAL
  Filled 2020-05-16 (×2): qty 1

## 2020-05-16 MED ORDER — SODIUM CHLORIDE 0.9 % IV SOLN: 250.0000 mL | INTRAVENOUS | Status: DC | PRN

## 2020-05-16 MED ORDER — CALCIUM CARBONATE ANTACID 500 MG PO CHEW: 2.0000 | CHEWABLE_TABLET | ORAL | Status: DC | PRN

## 2020-05-16 MED ORDER — ZOLPIDEM TARTRATE 5 MG PO TABS: 5.0000 mg | ORAL_TABLET | Freq: Every evening | ORAL | Status: DC | PRN

## 2020-05-16 MED ORDER — SODIUM CHLORIDE 0.9% FLUSH: 3.0000 mL | Freq: Once | INTRAVENOUS | Status: DC

## 2020-05-16 MED ORDER — LABETALOL HCL 5 MG/ML IV SOLN: 20.0000 mg | INTRAVENOUS | Status: DC | PRN

## 2020-05-16 MED ORDER — SODIUM CHLORIDE 0.9% FLUSH
3.0000 mL | Freq: Two times a day (BID) | INTRAVENOUS | Status: DC
  Administered 2020-05-16 – 2020-05-17 (×2): 3 mL via INTRAVENOUS

## 2020-05-16 MED ORDER — HYDRALAZINE HCL 20 MG/ML IJ SOLN: 10.0000 mg | INTRAMUSCULAR | Status: DC | PRN

## 2020-05-16 MED ORDER — PRENATAL MULTIVITAMIN CH
1.0000 | ORAL_TABLET | Freq: Every day | ORAL | Status: DC
  Administered 2020-05-16 – 2020-05-17 (×2): 1 via ORAL
  Filled 2020-05-16 (×2): qty 1

## 2020-05-16 MED ORDER — ACETAMINOPHEN 325 MG PO TABS: 650.0000 mg | ORAL_TABLET | ORAL | Status: DC | PRN

## 2020-05-16 MED ORDER — FUROSEMIDE 10 MG/ML IJ SOLN
20.0000 mg | INTRAMUSCULAR | Status: DC
  Administered 2020-05-16 (×2): 20 mg via INTRAVENOUS
  Filled 2020-05-16 (×2): qty 2

## 2020-05-16 MED ORDER — NIFEDIPINE ER OSMOTIC RELEASE 30 MG PO TB24
60.0000 mg | ORAL_TABLET | Freq: Once | ORAL | Status: AC
  Administered 2020-05-16: 60 mg via ORAL
  Filled 2020-05-16: qty 2

## 2020-05-16 NOTE — Progress Notes (Signed)
Patient ID: Kayla Campbell, female   DOB: 20-Jul-1991, 29 y.o.   MRN: [id number] Pt feeling much better this PM and has diuresed 6800 ml throughout the day.  Swelling improved.  Will hold lasix this PM so can rest and resume in AM q 8 hours.   Add procardia XL 60mg  po q hs as BP still not fully controlled. ECHO tomorrow.

## 2020-05-16 NOTE — ED Notes (Signed)
[facility] has been contacted the pt will be sent over to them

## 2020-05-16 NOTE — ED Triage Notes (Signed)
The pt delivered  By c-section June 22 since she has been home she has had a headache some chest pain and swelling all over her body

## 2020-05-16 NOTE — Lactation Note (Signed)
Lactation Consultation Note  Patient Name: Kayla Campbell [id number] Date: 05/16/2020  Kayla Campbell is a PP readmit.  Kayla Campbell has been mostly pumping and bottle feeding at home because he still is not latching well. Mom already has DEBP set up in room. Mom reports infant may come tonight and stay with her.  Praised pumping.  Urged to pump 8-12 times day.  Discussed may need to pump up to 30 minutes.  Mom reports pumping comfortable. Urged to call lactation as needed. Spoke with RN, who plans to put in Lactation Consult.   Maternal Data    Feeding    LATCH Score                   Interventions    Lactation Tools Discussed/Used     Consult Status      Zafir Schauer Michaelle Copas 05/16/2020, 8:39 PM

## 2020-05-16 NOTE — ED Provider Notes (Addendum)
[facility] EMERGENCY DEPARTMENT Provider Note   CSN: [id number] Arrival date & time: 05/16/20  0349     History Chief Complaint  Patient presents with  . Headache  . Hypertension    Kayla Campbell is a 29 y.o. female.  The history is provided by the patient and medical records.   29 y.o. G1P1 approx 5 days out from c-section at 39w presenting to the ED with LE edema.  States she had some mild swelling for last 2 weeks of pregnancy, blood pressure was always fairly well controlled. States she started to have worsening swelling prior to leaving the hospital. States of the past 2 days swelling has gotten exponentially worse, she can feel it up to the level of her knees and in her hands.  States her legs feel like they are about to explode and is to the point now that she is having a hard time even moving around because of the heaviness. Her blood pressure has also become increasingly elevated, 160/108 here. She is still taking her daily Lopressor as scheduled and has not missed any doses. She is experiencing some headaches and intermittent SOB, worse with exertion.  States she did have some occasional blood pressure spikes throughout pregnancy, but was never diagnosed as preeclamptic despite multiple checks. States she has contacted the OB/GYN clinic multiple times and spoken with triage nurse but has never gotten a call back from her physician for medication adjustments, etc.  States she was supposed to have appt for BP check next week.  Past Medical History:  Diagnosis Date  . Anemia   . Headache   . Hypertension     Patient Active Problem List   Diagnosis Date Noted  . Postpartum care following cesarean delivery 05/11/2020  . Chronic hypertension affecting pregnancy 05/10/2020    Past Surgical History:  Procedure Laterality Date  . CESAREAN SECTION N/A 05/11/2020   Procedure: CESAREAN SECTION;  Surgeon: Edwinna Areola, DO;  Location: [facility] LD ORS;  Service:  Obstetrics;  Laterality: N/A;  . WISDOM TOOTH EXTRACTION       OB History    Gravida  1   Para  1   Term  1   Preterm      AB      Living  1     SAB      TAB      Ectopic      Multiple  0   Live Births  1           Family History  Problem Relation Age of Onset  . Hypertension Mother     Social History   Tobacco Use  . Smoking status: Never Smoker  . Smokeless tobacco: Never Used  Vaping Use  . Vaping Use: Never used  Substance Use Topics  . Alcohol use: Not Currently    Comment: Socially   . Drug use: Not Currently    Home Medications Prior to Admission medications   Medication Sig Start Date End Date Taking? Authorizing Provider  ferrous sulfate 325 (65 FE) MG tablet Take 325 mg by mouth daily with breakfast. Unsure of which medication--iron pill    [provider]  ibuprofen (ADVIL) 800 MG tablet Take 1 tablet (800 mg total) by mouth every 8 (eight) hours. 05/12/20   Meisinger, Todd, MD  labetalol (NORMODYNE) 100 MG tablet Take 100 mg by mouth 2 (two) times daily.    [provider]  oxyCODONE (OXY IR/ROXICODONE) 5 MG immediate  release tablet Take 1 tablet (5 mg total) by mouth every 4 (four) hours as needed for severe pain. 05/12/20   Meisinger, Tawanna Cooler, MD  Pediatric Multivit-Minerals-C (FLINTSTONES GUMMIES PLUS PO) Take 1 each by mouth daily.    [provider]    Allergies    Penicillins and Smz-tmp ds [sulfamethoxazole-trimethoprim]  Review of Systems   Review of Systems  Cardiovascular: Positive for leg swelling.  All other systems reviewed and are negative.   Physical Exam Updated Vital Signs BP (!) 160/108 (BP Location: Right Arm)   Pulse 100   Temp 99 F (37.2 C) (Oral)   Resp 20   LMP 05/26/2019   SpO2 100%   Physical Exam Vitals and nursing note reviewed.  Constitutional:      Appearance: She is well-developed.  HENT:     Head: Normocephalic and atraumatic.  Eyes:     Conjunctiva/sclera:  Conjunctivae normal.     Pupils: Pupils are equal, round, and reactive to light.  Cardiovascular:     Rate and Rhythm: Normal rate and regular rhythm.     Heart sounds: Normal heart sounds.  Pulmonary:     Effort: Pulmonary effort is normal.     Breath sounds: Normal breath sounds.  Abdominal:     General: Bowel sounds are normal.     Palpations: Abdomen is soft.     Tenderness: There is no abdominal tenderness. There is no guarding or rebound.  Musculoskeletal:        General: Normal range of motion.     Cervical back: Normal range of motion.     Comments: 3+ pitting edema from feet up to knees, no overlying skin changes, swelling is grossly symmetric Swelling bilateral hands  Skin:    General: Skin is warm and dry.  Neurological:     Mental Status: She is alert and oriented to person, place, and time.     ED Results / Procedures / Treatments   Labs (all labs ordered are listed, but only abnormal results are displayed) Labs Reviewed  CBC - Abnormal; Notable for the following components:      Result Value   RBC 3.07 (*)    Hemoglobin 8.1 (*)    HCT 24.7 (*)    All other components within normal limits  I-STAT BETA HCG BLOOD, ED ([facility], [facility], [facility] ONLY) - Abnormal; Notable for the following components:   I-stat hCG, quantitative 27.9 (*)    All other components within normal limits  BASIC METABOLIC PANEL  HEPATIC FUNCTION PANEL  URINALYSIS, ROUTINE W REFLEX MICROSCOPIC  PROTEIN / CREATININE RATIO, URINE  BRAIN NATRIURETIC PEPTIDE  TROPONIN I (HIGH SENSITIVITY)    EKG None  Radiology No results found.  Procedures Procedures (including critical care time)  Medications Ordered in ED Medications  sodium chloride flush (NS) 0.9 % injection 3 mL (has no administration in time range)    ED Course  I have reviewed the triage vital signs and the nursing notes.  Pertinent labs & imaging results that were available during my care of the patient were reviewed by me and  considered in my medical decision making (see chart for details).    MDM Rules/Calculators/A&P  29 year old female status post C-section 5 days ago, presenting to the ED with swelling of lower extremities. States this started immediately after delivery but has worsened over the past few days. Has been trying to follow-up as an outpatient, however has not gotten a call back from her physician per her report.  Blood pressure has also started to elevate despite taking her Lopressor as directed. She is afebrile and nontoxic. She is hypertensive, 160/108 on arrival to ED. She has 3+ edema from her feet up to her knees on exam. Also has some noted swelling of the hands. She does report some intermittent shortness of breath but is in no acute respiratory distress at present. Oxygen saturations are stable on room air. She did have blood pressure issues during pregnancy, never diagnosed preeclamptic despite multiple checks. Given her worsening symptoms despite antihypertensive therapy, I do have concern for postpartum preeclampsia given degree of her lower extremity edema presently. She has been having some intermittent SOB so post-partum cardiomyopathy also a consideration.  Screening labs were sent, will add on LFTs, BNP, urinalysis, and urine protein/creatinine ratio.  CXR obtained, results pending.  4:54 AM Discussed exam findings and vitals with [facility] attending, Dr. Ilda Basset-- agrees appropraite to transfer to [facility] at this time.  Charge RN at [facility] to be notified.  Final Clinical Impression(s) / ED Diagnoses Final diagnoses:  Essential hypertension  Peripheral edema    Rx / DC Orders ED Discharge Orders    None       Larene Pickett, PA-C 05/16/20 0535    Larene Pickett, PA-C 05/16/20 2637    Varney Biles, MD 05/19/20 1402

## 2020-05-16 NOTE — Progress Notes (Signed)
VASCULAR LAB    Bilateral lower extremity venous duplex completed.    Preliminary report:  See CV proc for preliminary results.  Rigel Filsinger, RVT 05/16/2020, 7:53 AM

## 2020-05-16 NOTE — MAU Provider Note (Signed)
History     CSN: [id number]  Arrival date and time: 05/16/20 7681   First Provider Initiated Contact with Patient 05/16/20 0554      Chief Complaint  Patient presents with  . Headache  . Hypertension   Kayla Campbell is a 29 y.o. G1P1001 at 5 days PP s/p Primary C/S for Fetal Intolerance and Arrest of Descent.  She presents today for Headache and Hypertension and was initially evaluated in the [facility]. Upon transfer, she states she has had a headache "all day yesterday and then went away, but came back worse than before."  Patient states she didn't take anything for her headache, but did take ibuprofen and one dose of oxycodone in the am for pain mgmt r/t her C/S. Patient states she has been taking her blood pressures at home and it was 136-170s/86-90s.  Patient reports she takes Labetalol 100mg  BID and her last dose was at 2-3pm. Patient and mother also express concern about increased edema in legs and thighs.  Patient states edema has been problematic since prior to delivery, but has worsened since discharge.  Patient reports that the onset of chest pain/SOB caused increased concern for her and her mother.  Patient is currently breastfeeding and reports bleeding is good.       OB History    Gravida  1   Para  1   Term  1   Preterm      AB      Living  1     SAB      TAB      Ectopic      Multiple  0   Live Births  1           Past Medical History:  Diagnosis Date  . Anemia   . Headache   . Hypertension     Past Surgical History:  Procedure Laterality Date  . CESAREAN SECTION N/A 05/11/2020   Procedure: CESAREAN SECTION;  Surgeon: 05/13/2020, DO;  Location: [facility];  Service: Obstetrics;  Laterality: N/A;  . WISDOM TOOTH EXTRACTION      Family History  Problem Relation Age of Onset  . Hypertension Mother     Social History   Tobacco Use  . Smoking status: Never Smoker  . Smokeless tobacco: Never Used  Vaping Use  . Vaping Use: Never  used  Substance Use Topics  . Alcohol use: Not Currently    Comment: Socially   . Drug use: Not Currently    Allergies:  Allergies  Allergen Reactions  . Penicillins Other (See Comments)    childhood  . Smz-Tmp Ds [Sulfamethoxazole-Trimethoprim] Hives    Medications Prior to Admission  Medication Sig Dispense Refill Last Dose  . ferrous sulfate 325 (65 FE) MG tablet Take 325 mg by mouth daily with breakfast. Unsure of which medication--iron pill   05/15/2020 at Unknown time  . ibuprofen (ADVIL) 800 MG tablet Take 1 tablet (800 mg total) by mouth every 8 (eight) hours. 30 tablet 0 05/15/2020 at Unknown time  . labetalol (NORMODYNE) 100 MG tablet Take 100 mg by mouth 2 (two) times daily.   05/15/2020 at Unknown time  . oxyCODONE (OXY IR/ROXICODONE) 5 MG immediate release tablet Take 1 tablet (5 mg total) by mouth every 4 (four) hours as needed for severe pain. 10 tablet 0 05/15/2020 at Unknown time  . Pediatric Multivit-Minerals-C (FLINTSTONES GUMMIES PLUS PO) Take 1 each by mouth daily.   05/15/2020 at Unknown time  Review of Systems  Constitutional: Negative for chills and fever.  Respiratory: Positive for chest tightness ("pressure") and shortness of breath. Negative for cough.   Cardiovascular: Negative for chest pain.  Gastrointestinal: Positive for abdominal pain (S/t Incision). Negative for constipation, diarrhea, nausea and vomiting.  Genitourinary: Positive for vaginal bleeding. Negative for difficulty urinating, dysuria, pelvic pain and vaginal discharge.  Musculoskeletal: Positive for back pain (Spasms).  Neurological: Positive for headaches (8/10; Frontal Area-Aching with intermittent shooting in temporal area. ). Negative for dizziness and light-headedness.   Physical Exam   Blood pressure (!) 160/108, pulse 93, temperature 98.3 F (36.8 C), temperature source Oral, resp. rate (!) 23, last menstrual period 05/26/2019, SpO2 100 %, unknown if currently  breastfeeding. Vitals:   05/16/20 0403 05/16/20 0544  BP: (!) 160/108   Pulse: 100 93  Resp: 20 (!) 23  Temp: 99 F (37.2 C) 98.3 F (36.8 C)  TempSrc: Oral Oral  SpO2: 100%      Physical Exam Vitals reviewed.  Constitutional:      General: She is not in acute distress.    Appearance: She is well-developed. She is obese.  HENT:     Head: Normocephalic and atraumatic.  Eyes:     Conjunctiva/sclera: Conjunctivae normal.  Cardiovascular:     Rate and Rhythm: Normal rate and regular rhythm.     Heart sounds: Normal heart sounds.     Comments: Legs Tender to touch.  Circumference of left: 53cm below knee Right: 54cm below knee Pulmonary:     Effort: No respiratory distress.     Breath sounds: Normal breath sounds.  Abdominal:     Palpations: Abdomen is soft.     Tenderness: There is abdominal tenderness (Appropriately tender at incisional site. ).     Comments: Large Panus Honeycomb dressing removed; Steri strips in place. Incision well approximated, no discharge or erythema.  Musculoskeletal:        General: Swelling present. Normal range of motion.     Right lower leg: 3+ Pitting Edema present.     Left lower leg: 3+ Pitting Edema present.  Feet:     Right foot:     Skin integrity: Skin integrity normal. No warmth.     Left foot:     Skin integrity: Skin integrity normal. No warmth.  Skin:    General: Skin is warm and dry.  Neurological:     Mental Status: She is alert.  Psychiatric:        Mood and Affect: Mood normal.        Behavior: Behavior normal.     MAU Course  Procedures Results for orders placed or performed during the hospital encounter of 05/16/20 (from the past 24 hour(s))  Basic metabolic panel     Status: Abnormal   Collection Time: 05/16/20  4:29 AM  Result Value Ref Range   Sodium 138 135 - 145 mmol/L   Potassium 4.1 3.5 - 5.1 mmol/L   Chloride 104 98 - 111 mmol/L   CO2 24 22 - 32 mmol/L   Glucose, Bld 102 (H) 70 - 99 mg/dL   BUN 10 6  - 20 mg/dL   Creatinine, Ser 1.07 (H) 0.44 - 1.00 mg/dL   Calcium 9.2 8.9 - 10.3 mg/dL   GFR calc non Af Amer >60 >60 mL/min   GFR calc Af Amer >60 >60 mL/min   Anion gap 10 5 - 15  CBC     Status: Abnormal   Collection Time: 05/16/20  4:29 AM  Result Value Ref Range   WBC 10.3 4.0 - 10.5 K/uL   RBC 3.07 (L) 3.87 - 5.11 MIL/uL   Hemoglobin 8.1 (L) 12.0 - 15.0 g/dL   HCT 17.5 (L) 36 - 46 %   MCV 80.5 80.0 - 100.0 fL   MCH 26.4 26.0 - 34.0 pg   MCHC 32.8 30.0 - 36.0 g/dL   RDW 10.2 58.5 - 27.7 %   Platelets 316 150 - 400 K/uL   nRBC 0.2 0.0 - 0.2 %  Troponin I (High Sensitivity)     Status: Abnormal   Collection Time: 05/16/20  4:29 AM  Result Value Ref Range   Troponin I (High Sensitivity) 22 (H) <18 ng/L  Hepatic function panel     Status: Abnormal   Collection Time: 05/16/20  4:29 AM  Result Value Ref Range   Total Protein 6.7 6.5 - 8.1 g/dL   Albumin 2.6 (L) 3.5 - 5.0 g/dL   AST 27 15 - 41 U/L   ALT 22 0 - 44 U/L   Alkaline Phosphatase 82 38 - 126 U/L   Total Bilirubin 0.4 0.3 - 1.2 mg/dL   Bilirubin, Direct <8.2 0.0 - 0.2 mg/dL   Indirect Bilirubin NOT CALCULATED 0.3 - 0.9 mg/dL  Urinalysis, Routine w reflex microscopic     Status: Abnormal   Collection Time: 05/16/20  4:30 AM  Result Value Ref Range   Color, Urine YELLOW YELLOW   APPearance CLEAR CLEAR   Specific Gravity, Urine 1.008 1.005 - 1.030   pH 7.0 5.0 - 8.0   Glucose, UA NEGATIVE NEGATIVE mg/dL   Hgb urine dipstick LARGE (A) NEGATIVE   Bilirubin Urine NEGATIVE NEGATIVE   Ketones, ur NEGATIVE NEGATIVE mg/dL   Protein, ur NEGATIVE NEGATIVE mg/dL   Nitrite NEGATIVE NEGATIVE   Leukocytes,Ua MODERATE (A) NEGATIVE   RBC / HPF 6-10 0 - 5 RBC/hpf   WBC, UA 21-50 0 - 5 WBC/hpf   Bacteria, UA RARE (A) NONE SEEN   Squamous Epithelial / LPF 0-5 0 - 5  Protein / creatinine ratio, urine     Status: Abnormal   Collection Time: 05/16/20  4:30 AM  Result Value Ref Range   Creatinine, Urine 44.61 mg/dL   Total  Protein, Urine 10 mg/dL   Protein Creatinine Ratio 0.22 (H) 0.00 - 0.15 mg/mg[Cre]  Brain natriuretic peptide     Status: Abnormal   Collection Time: 05/16/20  4:32 AM  Result Value Ref Range   B Natriuretic Peptide 173.6 (H) 0.0 - 100.0 pg/mL  I-Stat beta hCG blood, ED     Status: Abnormal   Collection Time: 05/16/20  4:38 AM  Result Value Ref Range   I-stat hCG, quantitative 27.9 (H) <5 mIU/mL   Comment 3           DG Chest 2 View  Result Date: 05/16/2020 CLINICAL DATA:  C-section May 09, 2020. High blood pressure. Chest pain. Bilateral lower extremity swelling. EXAM: CHEST - 2 VIEW COMPARISON:  None. FINDINGS: No pneumothorax. The cardiac shadow is borderline in size. The hila and mediastinum are normal. No pulmonary nodules or masses. Mild increased haziness in the bases. No definitive focal infiltrates. There may be tiny pleural effusions based on the lateral view. IMPRESSION: 1. The cardiac silhouette is borderline in size. 2. Haziness over the bases may represent overlapping soft tissues versus pulmonary venous congestion. No focal infiltrate. 3. Probable tiny pleural effusions as noted on the lateral view. Electronically Signed  By: Gerome Sam III M.D   On: 05/16/2020 05:20    MDM Consult  Antihypertensive Labs as ordered by ED Physician Dopplers Assessment and Plan  29 year old  5 Days Postpartum CHTN SOB Headache BLE  -Patient arrived on unit and blood pressures elevated, but not range -PC Ratio returns at 0.22, BMP and LFTs pending. -Chest XRay as above. -Dr. Vergie Living consulted and informed of patient status as well as resulted labs. Advised: *Give Labetalol 200mg  bid and increase dosage. *Have patient to follow up as scheduled for BP check. *Suggest Ted Hose/SCDs and increased ambulation for edema. -Patient informed of MD recommendation. -Labetalol 200mg  ordered -Will order dopplers for r/o DVT. -Will await lab results.  05/16/2020, 5:54 AM    Reassessment (8:42 AM) -Results return as above and Dr. Cherre Robins. Anyanwu consulted.  Advised: -Admit -Start Saline Lock with IV Lasix 40mg   -Repeat Troponin Q6hrs -Echo -Dr.Richardson contacted and informed of recommendation. Agrees and requests admission orders to be placed with additions as below: *200mg  BID Labetalol *40Lasix IV *20 Lasix IV Q 4hrs *Regular *PIH IV Set in case of severe features. *Cardiac Monitoring  *Troponin Q6hrs *Confirm EKG  -Provider to bedside to inform patient of results and POC. -Patient confirms that EKG was completed in [facility] prior to arrival to [facility]. -Patient and mother without questions or concerns. -Informed that Dr. 05/18/2020 may order an Echo after assessment.  -Patient and mother expresses gratitude for care today. -Care assumed by Dr. Marquis Lunch.  MSN, CNM Advanced Practice Provider, [facility] 

## 2020-05-16 NOTE — ED Notes (Signed)
To mau now

## 2020-05-16 NOTE — H&P (Signed)
Kayla Campbell is an 29 y.o. female G1P1 POD #6  s/p primary c-section 05/10/20 for arrest of dilation and fetal intolerance of labor.  Over the last few days she noted increasing LE edema to the point it was painful to walk and increasing HA.  SHe had some chest tightness and mild SOB starting yesterday and came into the ED. Workup in the ED revealed CXR significant for probable mild pulmonary edema, LE dopplers WNL.  She had a slightly elevated troponin level at 22.  Pulse ox WNL and BP elevated to 150-167/64-98.  She was on labetalol 100mg  po BID at home for [facility].  Preeclampsia labs are normal.   OB History:  C-section 05/10/20   Menstrual History:  Patient's last menstrual period was 05/26/2019.    Past Medical History:  Diagnosis Date  . Anemia   . Headache   . Hypertension     Past Surgical History:  Procedure Laterality Date  . CESAREAN SECTION N/A 05/11/2020   Procedure: CESAREAN SECTION;  Surgeon: Sherlyn Hay, DO;  Location: [facility];  Service: Obstetrics;  Laterality: N/A;  . WISDOM TOOTH EXTRACTION      Family History  Problem Relation Age of Onset  . Hypertension Mother     Social History:  reports that she has never smoked. She has never used smokeless tobacco. She reports previous alcohol use. She reports previous drug use.  Allergies:  Allergies  Allergen Reactions  . Penicillins Other (See Comments)    childhood  . Smz-Tmp Ds [Sulfamethoxazole-Trimethoprim] Hives    Medications Prior to Admission  Medication Sig Dispense Refill Last Dose  . ferrous sulfate 325 (65 FE) MG tablet Take 325 mg by mouth daily with breakfast. Unsure of which medication--iron pill   05/15/2020 at Unknown time  . ibuprofen (ADVIL) 800 MG tablet Take 1 tablet (800 mg total) by mouth every 8 (eight) hours. 30 tablet 0 05/15/2020 at Unknown time  . labetalol (NORMODYNE) 100 MG tablet Take 100 mg by mouth 2 (two) times daily.   05/15/2020 at Unknown time  . oxyCODONE (OXY  IR/ROXICODONE) 5 MG immediate release tablet Take 1 tablet (5 mg total) by mouth every 4 (four) hours as needed for severe pain. 10 tablet 0 05/15/2020 at Unknown time  . Pediatric Multivit-Minerals-C (FLINTSTONES GUMMIES PLUS PO) Take 1 each by mouth daily.   05/15/2020 at Unknown time    Review of Systems  Constitutional: Positive for fatigue.  Respiratory: Positive for shortness of breath.   Cardiovascular: Positive for chest pain.  Gastrointestinal: Negative for abdominal pain.  Musculoskeletal: Positive for back pain.    Blood pressure (!) 146/98, pulse 90, temperature 98.7 F (37.1 C), temperature source Oral, resp. rate 20, last menstrual period 05/26/2019, SpO2 97 %, unknown if currently breastfeeding. Physical Exam Constitutional:      Appearance: She is well-developed.     Comments: obese  Cardiovascular:     Rate and Rhythm: Normal rate and regular rhythm.  Pulmonary:     Effort: Pulmonary effort is normal.  Abdominal:     Palpations: Abdomen is soft.     Comments: Incision well--approximated   Musculoskeletal:        General: Swelling (3+ pitting edema) present.  Neurological:     Mental Status: She is alert.  Psychiatric:        Mood and Affect: Mood normal.     Results for orders placed or performed during the hospital encounter of 05/16/20 (from the past 24 hour(s))  Basic metabolic panel     Status: Abnormal   Collection Time: 05/16/20  4:29 AM  Result Value Ref Range   Sodium 138 135 - 145 mmol/L   Potassium 4.1 3.5 - 5.1 mmol/L   Chloride 104 98 - 111 mmol/L   CO2 24 22 - 32 mmol/L   Glucose, Bld 102 (H) 70 - 99 mg/dL   BUN 10 6 - 20 mg/dL   Creatinine, Ser 3.66 (H) 0.44 - 1.00 mg/dL   Calcium 9.2 8.9 - 29.4 mg/dL   GFR calc non Af Amer >60 >60 mL/min   GFR calc Af Amer >60 >60 mL/min   Anion gap 10 5 - 15  CBC     Status: Abnormal   Collection Time: 05/16/20  4:29 AM  Result Value Ref Range   WBC 10.3 4.0 - 10.5 K/uL   RBC 3.07 (L) 3.87 - 5.11  MIL/uL   Hemoglobin 8.1 (L) 12.0 - 15.0 g/dL   HCT 76.5 (L) 36 - 46 %   MCV 80.5 80.0 - 100.0 fL   MCH 26.4 26.0 - 34.0 pg   MCHC 32.8 30.0 - 36.0 g/dL   RDW 46.5 03.5 - 46.5 %   Platelets 316 150 - 400 K/uL   nRBC 0.2 0.0 - 0.2 %  Troponin I (High Sensitivity)     Status: Abnormal   Collection Time: 05/16/20  4:29 AM  Result Value Ref Range   Troponin I (High Sensitivity) 22 (H) <18 ng/L  Hepatic function panel     Status: Abnormal   Collection Time: 05/16/20  4:29 AM  Result Value Ref Range   Total Protein 6.7 6.5 - 8.1 g/dL   Albumin 2.6 (L) 3.5 - 5.0 g/dL   AST 27 15 - 41 U/L   ALT 22 0 - 44 U/L   Alkaline Phosphatase 82 38 - 126 U/L   Total Bilirubin 0.4 0.3 - 1.2 mg/dL   Bilirubin, Direct <6.8 0.0 - 0.2 mg/dL   Indirect Bilirubin NOT CALCULATED 0.3 - 0.9 mg/dL  Urinalysis, Routine w reflex microscopic     Status: Abnormal   Collection Time: 05/16/20  4:30 AM  Result Value Ref Range   Color, Urine YELLOW YELLOW   APPearance CLEAR CLEAR   Specific Gravity, Urine 1.008 1.005 - 1.030   pH 7.0 5.0 - 8.0   Glucose, UA NEGATIVE NEGATIVE mg/dL   Hgb urine dipstick LARGE (A) NEGATIVE   Bilirubin Urine NEGATIVE NEGATIVE   Ketones, ur NEGATIVE NEGATIVE mg/dL   Protein, ur NEGATIVE NEGATIVE mg/dL   Nitrite NEGATIVE NEGATIVE   Leukocytes,Ua MODERATE (A) NEGATIVE   RBC / HPF 6-10 0 - 5 RBC/hpf   WBC, UA 21-50 0 - 5 WBC/hpf   Bacteria, UA RARE (A) NONE SEEN   Squamous Epithelial / LPF 0-5 0 - 5  Protein / creatinine ratio, urine     Status: Abnormal   Collection Time: 05/16/20  4:30 AM  Result Value Ref Range   Creatinine, Urine 44.61 mg/dL   Total Protein, Urine 10 mg/dL   Protein Creatinine Ratio 0.22 (H) 0.00 - 0.15 mg/mg[Cre]  Brain natriuretic peptide     Status: Abnormal   Collection Time: 05/16/20  4:32 AM  Result Value Ref Range   B Natriuretic Peptide 173.6 (H) 0.0 - 100.0 pg/mL  I-Stat beta hCG blood, ED     Status: Abnormal   Collection Time: 05/16/20  4:38 AM   Result Value Ref Range   I-stat hCG, quantitative 27.9 (  H) <5 mIU/mL   Comment 3          Troponin I (High Sensitivity)     Status: Abnormal   Collection Time: 05/16/20 10:32 AM  Result Value Ref Range   Troponin I (High Sensitivity) 22 (H) <18 ng/L  Type and screen  [facility]     Status: None   Collection Time: 05/16/20 10:35 AM  Result Value Ref Range   ABO/RH(D) A POS    Antibody Screen NEG    Sample Expiration      05/19/2020,2359 Performed at [facility], [address], [place], [place] [address]      Assessment/Plan: Pt with volume overload and CHTN with elevated BP. Pt on telemetry given slightly elevated troponin level, but repeat so far has been stable at 22.  Plan diuresis with IV lasix over next 24 hours and echocardiogram to r/o any pp cardiomyopathy.  Daily weights. Increased procardia to 200mg  po BID and BP improving but has IV ordered for any severe range BP and d/w pt and mother will see if needs addition of other medication to control. Has breast pump at bedside. Mother and pt updated on plan of care.  Pt's mother lost her son (pt's brother ) at 55 y/o to a motorcycle accident in last year and has just been very stressed and worried about Kayla Campbell.  Ibuprofen for HA, pt states currently improved.   30 05/16/2020, 12:55 PM

## 2020-05-16 NOTE — MAU Note (Signed)
Patient arrives at MAU from the main ED after a c-section on Tuesday June 22. Pt has swelling in her legs, elevated B/P, shortness of breath, chest discomfort and back spasms.

## 2020-05-17 ENCOUNTER — Inpatient Hospital Stay (HOSPITAL_COMMUNITY): Payer: 59

## 2020-05-17 DIAGNOSIS — R079 Chest pain, unspecified: Secondary | ICD-10-CM

## 2020-05-17 LAB — CBC
HCT: 26.3 % — ABNORMAL LOW (ref 36.0–46.0)
Hemoglobin: 8.6 g/dL — ABNORMAL LOW (ref 12.0–15.0)
MCH: 26.2 pg (ref 26.0–34.0)
MCHC: 32.7 g/dL (ref 30.0–36.0)
MCV: 80.2 fL (ref 80.0–100.0)
Platelets: 375 10*3/uL (ref 150–400)
RBC: 3.28 MIL/uL — ABNORMAL LOW (ref 3.87–5.11)
RDW: 15.2 % (ref 11.5–15.5)
WBC: 9.4 10*3/uL (ref 4.0–10.5)
nRBC: 0 % (ref 0.0–0.2)

## 2020-05-17 LAB — BASIC METABOLIC PANEL
Anion gap: 13 (ref 5–15)
BUN: 11 mg/dL (ref 6–20)
CO2: 24 mmol/L (ref 22–32)
Calcium: 9 mg/dL (ref 8.9–10.3)
Chloride: 101 mmol/L (ref 98–111)
Creatinine, Ser: 1.01 mg/dL — ABNORMAL HIGH (ref 0.44–1.00)
GFR calc Af Amer: >60 mL/min (ref >60)
GFR calc non Af Amer: >60 mL/min (ref >60)
Glucose, Bld: 105 mg/dL — ABNORMAL HIGH (ref 70–99)
Potassium: 4 mmol/L (ref 3.5–5.1)
Sodium: 138 mmol/L (ref 135–145)

## 2020-05-17 LAB — ECHOCARDIOGRAM COMPLETE
Height: 61 in
Weight: 4405.67 oz

## 2020-05-17 MED ORDER — LABETALOL HCL 200 MG PO TABS: 200.0000 mg | ORAL_TABLET | Freq: Two times a day (BID) | ORAL | 1 refills | Status: AC

## 2020-05-17 NOTE — Discharge Instructions (Signed)
Call for increasing SOB 

## 2020-05-17 NOTE — Progress Notes (Signed)
Able to ambulate and tolerate PO, feels good I/O -7.9 L BP stable Will d/c home on Labetalol 200 mg bid, call for problems

## 2020-05-17 NOTE — Progress Notes (Signed)
HD #2, POD #7, postpartum pulmonary edema Feeling much better Afeb, VSS, BP stable  ECHO-normal Labs-normal  Will continue on Labetalol 200 mg bid for BP control, have her ambulate, d/c telemetry, re-eval this pm for discharge

## 2020-05-17 NOTE — Progress Notes (Signed)
Feeling better Getting ECHO in her room Will evaluate later

## 2020-05-17 NOTE — Progress Notes (Signed)
  Echocardiogram 2D Echocardiogram has been performed.  Kayla Campbell 05/17/2020, 8:49 AM

## 2020-05-20 NOTE — Discharge Summary (Signed)
Physician Discharge Summary  Patient ID: Kayla Campbell MRN: [id number] DOB/AGE: 21-Oct-1991 29 y.o.  Admit date: 05/16/2020 Discharge date: 05/17/2020  Admission Diagnoses:  Postpartum pulmonary edema  Discharge Diagnoses: Same Active Problems:   Elevated troponin   Discharged Condition: good  Hospital Course: Pt admitted by Dr. Senaida Ores with postpartum fluid overload and mild CHTN.  She was aggressively diuresed, eventually lost 7.9 liters.  BP controlled with PO Labetalol 200 mg bid and one dose of Procardia.  On the day after admission, she felt significantly improved after diuresis.  ECHO was normal.  She was felt to be stable for discharge   Significant Diagnostic Studies: radiology: ECHO   Discharge Exam: Blood pressure 130/63, pulse 96, temperature 98 F (36.7 C), temperature source Oral, resp. rate 18, height 5\' 1"  (1.549 m), weight 124.9 kg, last menstrual period 05/26/2019, SpO2 96 %, unknown if currently breastfeeding. General appearance: alert  Disposition: Discharge disposition: 01-Home or Self Care       Discharge Instructions    Diet - low sodium heart healthy   Complete by: As directed    Lifting restrictions   Complete by: As directed    Weight restriction of 10 lbs.     Allergies as of 05/17/2020      Reactions   Penicillins Hives   childhood   Smz-tmp Ds [sulfamethoxazole-trimethoprim] Hives      Medication List    TAKE these medications   ferrous sulfate 325 (65 FE) MG tablet Take 325 mg by mouth daily with breakfast.   FLINTSTONES GUMMIES PLUS PO Take 2 each by mouth daily.   ibuprofen 800 MG tablet Commonly known as: ADVIL Take 1 tablet (800 mg total) by mouth every 8 (eight) hours.   labetalol 200 MG tablet Commonly known as: NORMODYNE Take 1 tablet (200 mg total) by mouth 2 (two) times daily. What changed:   medication strength  how much to take   oxyCODONE 5 MG immediate release tablet Commonly known as: Oxy  IR/ROXICODONE Take 1 tablet (5 mg total) by mouth every 4 (four) hours as needed for severe pain.       Follow-up Information    05/19/2020, DO. Schedule an appointment as soon as possible for a visit in 1 week(s).   Specialty: Obstetrics and Gynecology Contact information: [address] [phone]               Signed: [phone] [name] 05/20/2020, 12:51 PM

## 2021-08-01 IMAGING — DX DG CHEST 2V
2 series · 2 of 2 positions shown · non-contrast
Comparison: None.

CLINICAL DATA: C-section May 09, 2020. High blood pressure. Chest
pain. Bilateral lower extremity swelling.

EXAM:
CHEST - 2 VIEW

[chest pa]
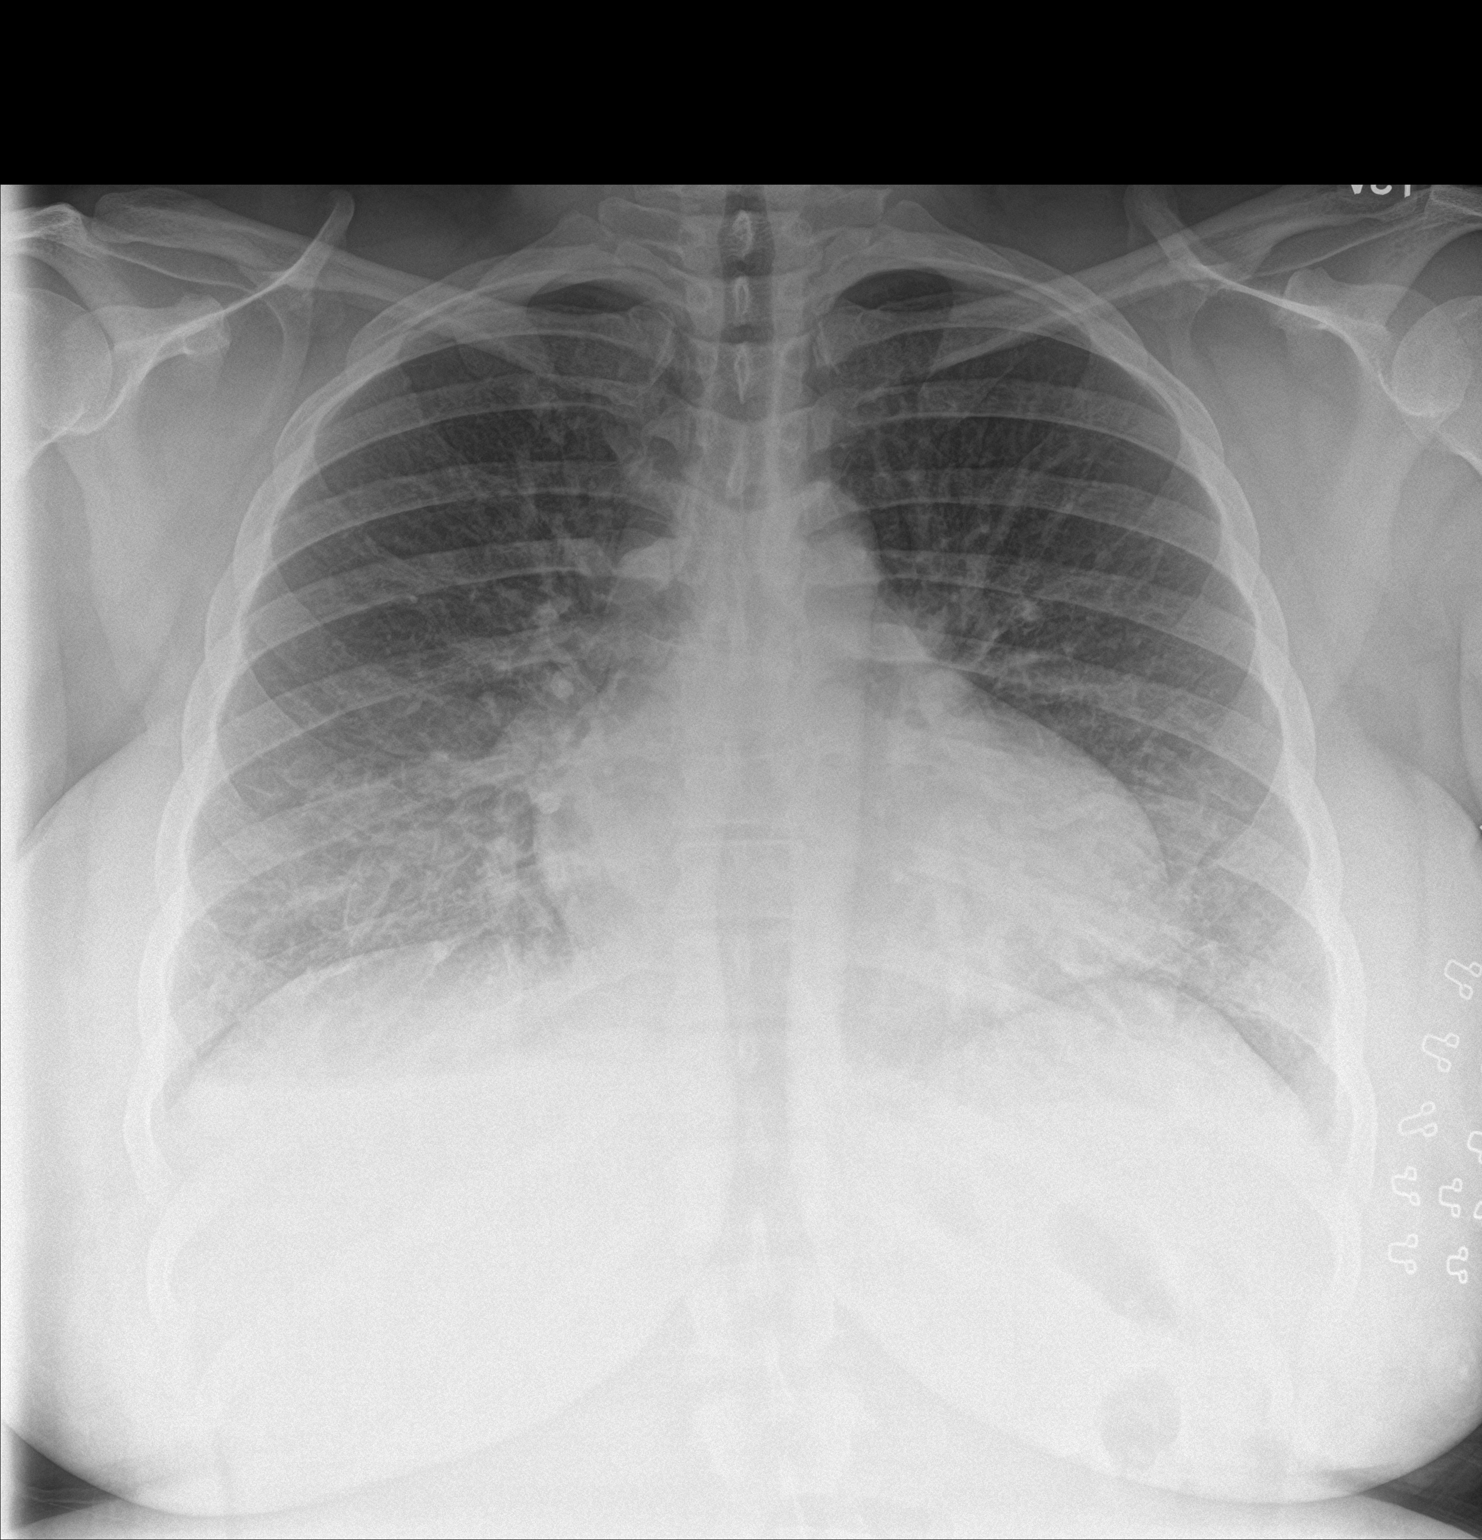

[chest lat]
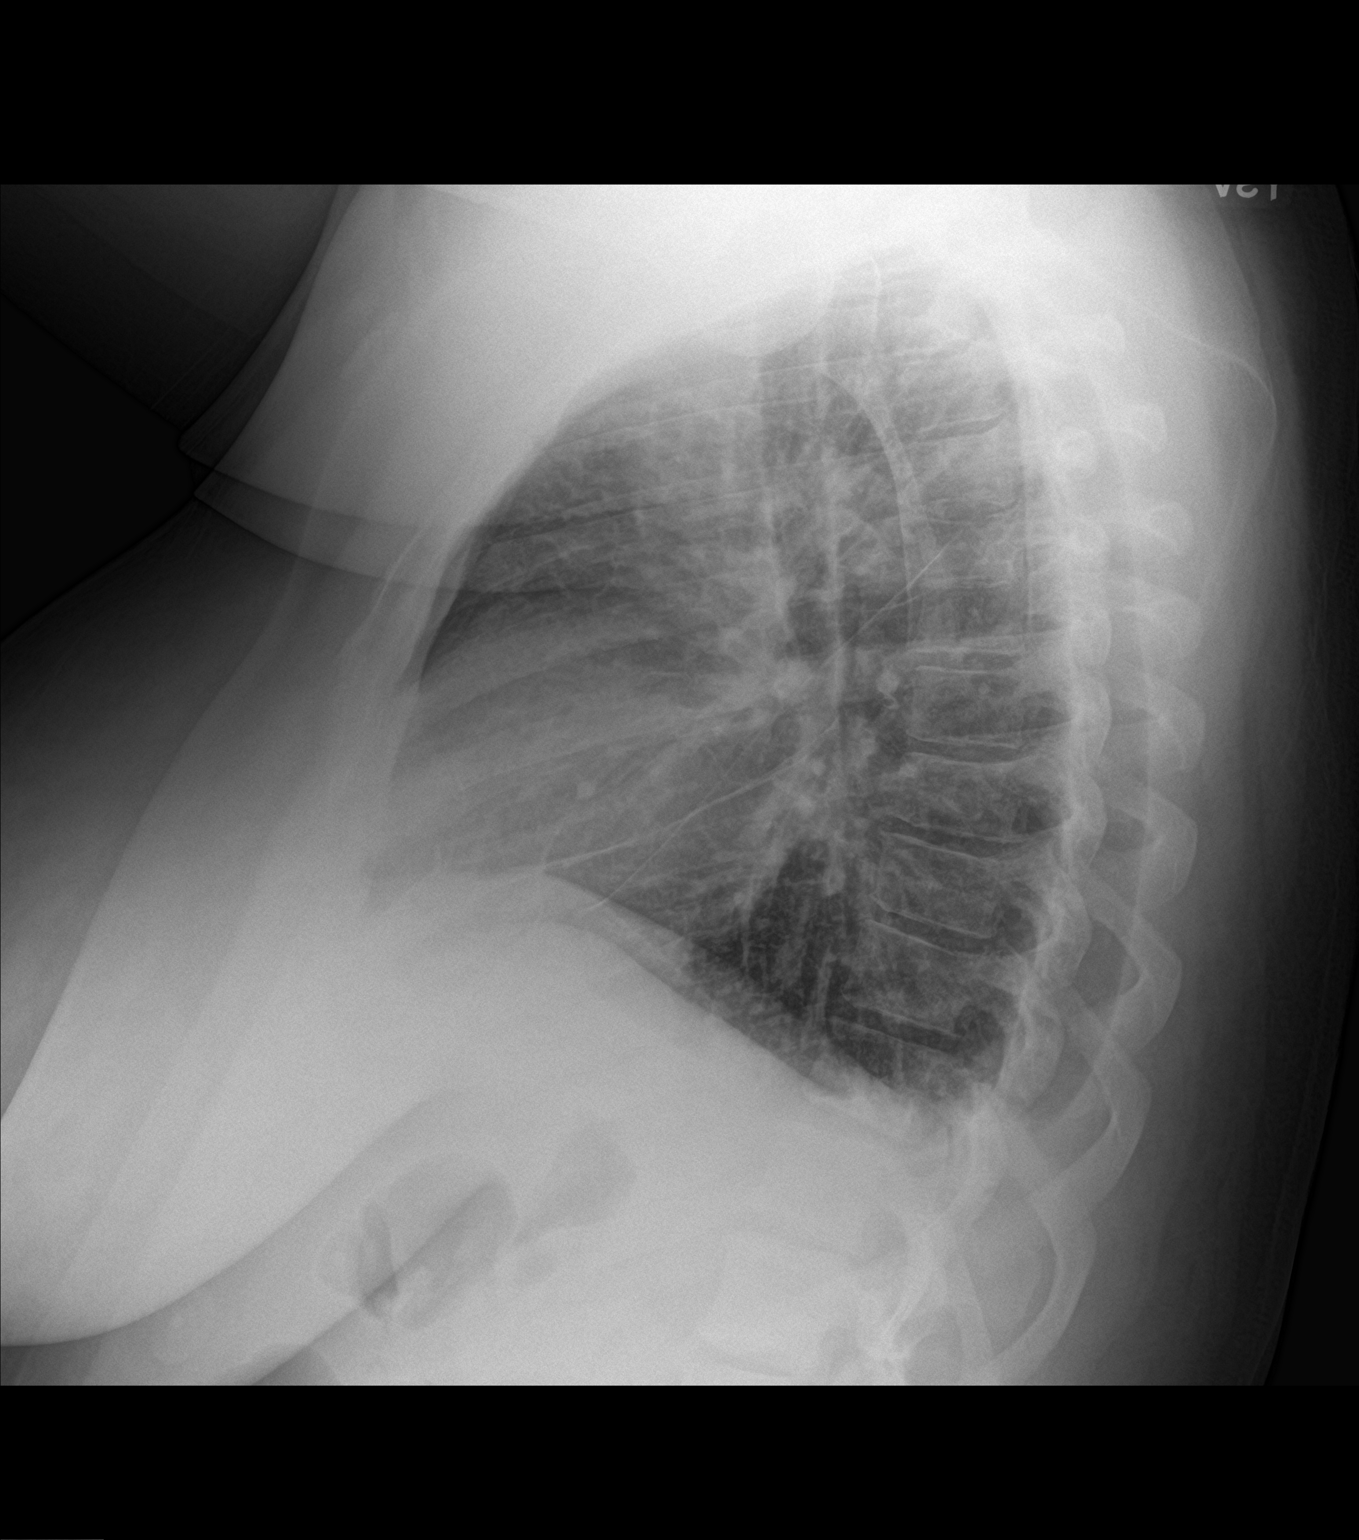

[2 of 2 positions shown; findings below may reference images not displayed]

FINDINGS: No pneumothorax. The cardiac shadow is borderline in size. The hila
and mediastinum are normal. No pulmonary nodules or masses. Mild
increased haziness in the bases. No definitive focal infiltrates.
There may be tiny pleural effusions based on the lateral view.
IMPRESSION: 1. The cardiac silhouette is borderline in size.
2. Haziness over the bases may represent overlapping soft tissues
versus pulmonary venous congestion. No focal infiltrate.
3. Probable tiny pleural effusions as noted on the lateral view.

## 2022-01-14 ENCOUNTER — Emergency Department (HOSPITAL_BASED_OUTPATIENT_CLINIC_OR_DEPARTMENT_OTHER): Payer: 59

## 2022-01-14 ENCOUNTER — Emergency Department (HOSPITAL_COMMUNITY)
Admission: EM | Admit: 2022-01-14 | Discharge: 2022-01-14 | Disposition: A | Discharge status: Home / Self Care | Payer: 59 | Attending: Emergency Medicine | Admitting: Emergency Medicine

## 2022-01-14 ENCOUNTER — Emergency Department (HOSPITAL_COMMUNITY): Payer: 59

## 2022-01-14 ENCOUNTER — Other Ambulatory Visit: Payer: Self-pay

## 2022-01-14 ENCOUNTER — Encounter (HOSPITAL_COMMUNITY): Payer: Self-pay | Admitting: *Deleted

## 2022-01-14 DIAGNOSIS — R7989 Other specified abnormal findings of blood chemistry: Secondary | ICD-10-CM

## 2022-01-14 DIAGNOSIS — R791 Abnormal coagulation profile: Secondary | ICD-10-CM | POA: Diagnosis present

## 2022-01-14 DIAGNOSIS — M79604 Pain in right leg: Secondary | ICD-10-CM | POA: Diagnosis not present

## 2022-01-14 DIAGNOSIS — I10 Essential (primary) hypertension: Secondary | ICD-10-CM | POA: Insufficient documentation

## 2022-01-14 DIAGNOSIS — M79661 Pain in right lower leg: Secondary | ICD-10-CM | POA: Insufficient documentation

## 2022-01-14 DIAGNOSIS — Z79899 Other long term (current) drug therapy: Secondary | ICD-10-CM | POA: Insufficient documentation

## 2022-01-14 DIAGNOSIS — N649 Disorder of breast, unspecified: Secondary | ICD-10-CM

## 2022-01-14 LAB — CBC WITH DIFFERENTIAL/PLATELET
Abs Immature Granulocytes: 0.01 10*3/uL (ref 0.00–0.07)
Basophils Absolute: 0 10*3/uL (ref 0.0–0.1)
Basophils Relative: 0 %
Eosinophils Absolute: 0.2 10*3/uL (ref 0.0–0.5)
Eosinophils Relative: 3 %
HCT: 37.9 % (ref 36.0–46.0)
Hemoglobin: 12.7 g/dL (ref 12.0–15.0)
Immature Granulocytes: 0 %
Lymphocytes Relative: 40 %
Lymphs Abs: 2.6 10*3/uL (ref 0.7–4.0)
MCH: 27 pg (ref 26.0–34.0)
MCHC: 33.5 g/dL (ref 30.0–36.0)
MCV: 80.6 fL (ref 80.0–100.0)
Monocytes Absolute: 0.6 10*3/uL (ref 0.1–1.0)
Monocytes Relative: 9 %
Neutro Abs: 3.1 10*3/uL (ref 1.7–7.7)
Neutrophils Relative %: 48 %
Platelets: 394 10*3/uL (ref 150–400)
RBC: 4.7 MIL/uL (ref 3.87–5.11)
RDW: 14.1 % (ref 11.5–15.5)
WBC: 6.5 10*3/uL (ref 4.0–10.5)
nRBC: 0 % (ref 0.0–0.2)

## 2022-01-14 LAB — URINALYSIS, ROUTINE W REFLEX MICROSCOPIC
Bilirubin Urine: NEGATIVE
Glucose, UA: NEGATIVE mg/dL
Ketones, ur: NEGATIVE mg/dL
Leukocytes,Ua: NEGATIVE
Nitrite: NEGATIVE
Protein, ur: NEGATIVE mg/dL
Specific Gravity, Urine: 1.009 (ref 1.005–1.030)
pH: 7 (ref 5.0–8.0)

## 2022-01-14 LAB — COMPREHENSIVE METABOLIC PANEL
ALT: 19 U/L (ref 0–44)
AST: 28 U/L (ref 15–41)
Albumin: 4 g/dL (ref 3.5–5.0)
Alkaline Phosphatase: 107 U/L (ref 38–126)
Anion gap: 10 (ref 5–15)
BUN: 9 mg/dL (ref 6–20)
CO2: 26 mmol/L (ref 22–32)
Calcium: 9.5 mg/dL (ref 8.9–10.3)
Chloride: 102 mmol/L (ref 98–111)
Creatinine, Ser: 0.8 mg/dL (ref 0.44–1.00)
GFR, Estimated: >60 mL/min (ref >60)
Glucose, Bld: 94 mg/dL (ref 70–99)
Potassium: 3.7 mmol/L (ref 3.5–5.1)
Sodium: 138 mmol/L (ref 135–145)
Total Bilirubin: <0.1 mg/dL — ABNORMAL LOW (ref 0.3–1.2)
Total Protein: 8.3 g/dL — ABNORMAL HIGH (ref 6.5–8.1)

## 2022-01-14 LAB — I-STAT BETA HCG BLOOD, ED (MC, WL, AP ONLY): I-stat hCG, quantitative: <5 m[IU]/mL (ref <5)

## 2022-01-14 MED ORDER — IOHEXOL 350 MG/ML SOLN
67.0000 mL | Freq: Once | INTRAVENOUS | Status: AC | PRN
  Administered 2022-01-14: 67 mL via INTRAVENOUS

## 2022-01-14 MED ORDER — AMLODIPINE BESYLATE 5 MG PO TABS
5.0000 mg | ORAL_TABLET | Freq: Once | ORAL | Status: AC
  Administered 2022-01-14: 5 mg via ORAL
  Filled 2022-01-14: qty 1

## 2022-01-14 NOTE — ED Provider Notes (Signed)
[facility] EMERGENCY DEPARTMENT Provider Note   CSN: [id number] Arrival date & time: 01/14/22  1556     History  Chief Complaint  Patient presents with   abnormal labs    Elevated D-dimer    Kayla Campbell is a 31 y.o. female with a past medical history of hypertension presenting today due to an elevated D-dimer.  Patient reports that 2 days ago she was at urgent care for uncontrolled hypertension as well as lower extremity swelling.  He was diagnosed with a hypertensive urgency.  She was told to follow-up with her primary care provider sometime this upcoming week however she was called by the urgent care today and they notified her that her D-dimer was elevated to 1.34 and suggested that she go to the emergency department for work-up of a blood clot.  She reports that for the past week she has been having inflammation and discomfort in her right lower extremity.  She thought it was due to her new prescription, amlodipine, that was prescribed on 2/22 by her PCP.  She says that the leg pain only comes on when she is walking.  Denies any dizziness, shortness of breath, chest pain, palpitations, history of blood clot or tobacco use.  Does state that she was recently started on a norethindrone 3 weeks ago because her insurance no longer covered her previous medication.  She has always been on estrogen free contraception due to her hypertension.  Also reports a 3-hour drive to her parents within the last week.    Home Medications Prior to Admission medications   Medication Sig Start Date End Date Taking? Authorizing Provider  ferrous sulfate 325 (65 FE) MG tablet Take 325 mg by mouth daily with breakfast.     [provider]  ibuprofen (ADVIL) 800 MG tablet Take 1 tablet (800 mg total) by mouth every 8 (eight) hours. 05/12/20   Meisinger, Tawanna Cooler, MD  labetalol (NORMODYNE) 200 MG tablet Take 1 tablet (200 mg total) by mouth 2 (two) times daily. 05/17/20   Meisinger, Tawanna Cooler, MD   oxyCODONE (OXY IR/ROXICODONE) 5 MG immediate release tablet Take 1 tablet (5 mg total) by mouth every 4 (four) hours as needed for severe pain. 05/12/20   Meisinger, Tawanna Cooler, MD  Pediatric Multivit-Minerals-C (FLINTSTONES GUMMIES PLUS PO) Take 2 each by mouth daily.     [provider]      Allergies    Penicillins and Smz-tmp ds [sulfamethoxazole-trimethoprim]    Review of Systems   Review of Systems See HPI  Physical Exam Updated Vital Signs BP (!) 139/100 (BP Location: Right Arm)    Pulse 80    Temp 98 F (36.7 C) (Oral)    Resp 16    Ht 5\' 1"  (1.549 m)    Wt 120.2 kg    LMP 12/26/2021    SpO2 99%    BMI 50.07 kg/m  Physical Exam Vitals and nursing note reviewed.  Constitutional:      Appearance: Normal appearance. She is not ill-appearing.  HENT:     Head: Normocephalic and atraumatic.  Eyes:     General: No scleral icterus.    Conjunctiva/sclera: Conjunctivae normal.  Cardiovascular:     Rate and Rhythm: Normal rate and regular rhythm.  Pulmonary:     Effort: Pulmonary effort is normal. No respiratory distress.     Breath sounds: No wheezing.  Musculoskeletal:        General: No swelling or tenderness.     Right lower leg:  No edema.     Left lower leg: No edema.     Comments: No appreciable edema to either lower extremity.  Strong DP pulses.  Skin:    Findings: No rash.  Neurological:     Mental Status: She is alert.  Psychiatric:        Mood and Affect: Mood normal.    ED Results / Procedures / Treatments   Labs (all labs ordered are listed, but only abnormal results are displayed) Labs Reviewed  COMPREHENSIVE METABOLIC PANEL - Abnormal; Notable for the following components:      Result Value   Total Protein 8.3 (*)    Total Bilirubin <0.1 (*)    All other components within normal limits  URINALYSIS, ROUTINE W REFLEX MICROSCOPIC - Abnormal; Notable for the following components:   Color, Urine STRAW (*)    APPearance HAZY (*)    Hgb urine dipstick  SMALL (*)    Bacteria, UA RARE (*)    All other components within normal limits  CBC WITH DIFFERENTIAL/PLATELET  I-STAT BETA HCG BLOOD, ED ([facility], [facility], [facility] ONLY)    EKG None  Radiology CT Angio Chest PE W and/or Wo Contrast  Result Date: 01/14/2022 CLINICAL DATA:  Pulmonary embolism (PE) suspected, positive D-dimer EXAM: CT ANGIOGRAPHY CHEST WITH CONTRAST TECHNIQUE: Multidetector CT imaging of the chest was performed using the standard protocol during bolus administration of intravenous contrast. Multiplanar CT image reconstructions and MIPs were obtained to evaluate the vascular anatomy. RADIATION DOSE REDUCTION: This exam was performed according to the departmental dose-optimization program which includes automated exposure control, adjustment of the mA and/or kV according to patient size and/or use of iterative reconstruction technique. CONTRAST:  67mL OMNIPAQUE IOHEXOL 350 MG/ML SOLN COMPARISON:  Chest x-ray 05/16/2020 FINDINGS: Cardiovascular: Satisfactory opacification of the pulmonary arteries to the segmental level. No evidence of pulmonary embolism. Thoracic aorta is normal in course and caliber. Normal heart size. No pericardial effusion. Mediastinum/Nodes: No enlarged mediastinal, hilar, or axillary lymph nodes. Thyroid gland, trachea, and esophagus demonstrate no significant findings. Lungs/Pleura: Lungs are clear. No pleural effusion or pneumothorax. Upper Abdomen: Diffusely decreased attenuation of the hepatic parenchyma. No acute findings within the included upper abdomen. Musculoskeletal: No acute osseous abnormality. No suspicious bone lesion. Rounded 2.1 x 2.1 cm lesion within the upper left breast (series 7, image 20). Review of the MIP images confirms the above findings. IMPRESSION: 1. No evidence of pulmonary embolism or other acute intrathoracic process. 2. Rounded 2.1 x 2.1 cm lesion within the upper left breast, indeterminate. Recommend follow-up with nonemergent diagnostic  mammography. 3. Hepatic steatosis. Electronically Signed   By: Duanne GuessNicholas  Plundo D.O.   On: 01/14/2022 18:08   VAS US LOWER EXTREMITY VENOUS (DVT) (7a-7p)  Result Date: 01/14/2022  Lower Venous DVT Study Patient Name:  Kayla FurbishAVIA Fronek  Date of Exam:   01/14/2022 Medical Rec #: [id number]      Accession #:    [id number] Date of Birth: 01/31/1991       Patient Gender: F Patient Age:   [age] years [facility] Procedure:      VAS US LOWER EXTREMITY VENOUS (DVT) Referring Phys: Rosela Supak --------------------------------------------------------------------------------  Indications: Pain, and Elevated D-Dimer.  Limitations: Body habitus. Comparison Study: Prior negative bilateral LEV done 05/16/20 Performing Technologist: Sherren Kernsandace Kanady RVS  Examination Guidelines: A complete evaluation includes B-mode imaging, spectral Doppler, color Doppler, and power Doppler as needed of all accessible portions of each vessel. Bilateral testing is considered an integral  part of a complete examination. Limited examinations for reoccurring indications may be performed as noted. The reflux portion of the exam is performed with the patient in reverse Trendelenburg.  +---------+---------------+---------+-----------+----------+--------------+  RIGHT     Compressibility Phasicity Spontaneity Properties Thrombus Aging  +---------+---------------+---------+-----------+----------+--------------+  CFV       Full            Yes       Yes                                    +---------+---------------+---------+-----------+----------+--------------+  SFJ       Full                                                             +---------+---------------+---------+-----------+----------+--------------+  FV Prox   Full            Yes       Yes                                    +---------+---------------+---------+-----------+----------+--------------+  FV Mid    Full                                                              +---------+---------------+---------+-----------+----------+--------------+  FV Distal Full                                                             +---------+---------------+---------+-----------+----------+--------------+  PFV       Full            Yes       Yes                                    +---------+---------------+---------+-----------+----------+--------------+  POP       Full                                                             +---------+---------------+---------+-----------+----------+--------------+  PTV       Full                                                             +---------+---------------+---------+-----------+----------+--------------+  PERO      Full                                                             +---------+---------------+---------+-----------+----------+--------------+   +----+---------------+---------+-----------+----------+--------------+  LEFT Compressibility Phasicity Spontaneity Properties Thrombus Aging  +----+---------------+---------+-----------+----------+--------------+  CFV  Full            Yes       Yes                                    +----+---------------+---------+-----------+----------+--------------+     Summary: RIGHT: - No evidence of deep vein thrombosis in the lower extremity. No indirect evidence of obstruction proximal to the inguinal ligament. - No cystic structure found in the popliteal fossa. - Ultrasound characteristics of enlarged lymph nodes are noted in the groin.  LEFT: - No evidence of common femoral vein obstruction.  *See table(s) above for measurements and observations.    Preliminary     Procedures Procedures   Medications Ordered in ED Medications - No data to display  ED Course/ Medical Decision Making/ A&P                           Medical Decision Making Amount and/or Complexity of Data Reviewed Labs: ordered. Radiology: ordered. ECG/medicine tests: ordered.   31 year old female presented today due to  an elevated D-dimer.  This was obtained 2 days ago at urgent care.  She had been complaining about right lower leg discomfort so initially, there was concern for a blood clot in her lower extremities.  Imaging: Right lower extremity DVT was ordered and reviewed by me.  I did not note any signs of thrombus.  Radiology called with a negative result as well.  CT angio ordered and independently viewed by me nonemergent mammography for a left upper breast lesion.  Patient and her mother were made aware and they will speak to primary care provider about this.  Disposition: Patient is asymptomatic and without evidence of DVT/PE.  She is stable to follow-up outpatient with primary care about her right leg discomfort, problems with blood pressure control and breast lesion for which she will likely need to be set up for an outpatient mammogram.  She is agreeable to this plan.  Final Clinical Impression(s) / ED Diagnoses Final diagnoses:  Positive D dimer    Rx / DC Orders   Results and diagnoses were explained to the patient and her mother. Return precautions discussed in full. Patient had no additional questions and expressed complete understanding.   This chart was dictated using voice recognition software.  Despite best efforts to proofread,  errors can occur which can change the documentation meaning.    Woodroe Chen 01/14/22 1936    Alvira Monday, MD 01/15/22 1017

## 2022-01-14 NOTE — ED Triage Notes (Signed)
Pt was seen at her pcp's for htn on Thursday.  Today they called her and told her that her d-dimer was elevated.  Pt c/o R knee pain and swelling since Wed.  Denies sob or chest pain.

## 2022-01-14 NOTE — Discharge Instructions (Addendum)
Please follow-up with your primary care provider about your leg discomfort, fatigue and blood pressure control.  Return with any worsening symptoms.

## 2022-01-14 NOTE — Progress Notes (Signed)
VASCULAR LAB    Right lower extremity venous duplex has been performed.  See CV proc for preliminary results.  Messaged results to [facility], PA-C via secure chat  Sharion Dove, RVT 01/14/2022, 6:09 PM

## 2022-02-08 ENCOUNTER — Encounter (HOSPITAL_COMMUNITY): Payer: Self-pay | Admitting: Radiology

## 2023-04-01 IMAGING — CT CT ANGIO CHEST
2 of 7 series · 18 of 46 positions shown · IV contrast (APPLIED)
Comparison: Chest x-ray 05/16/2020

CLINICAL DATA: Pulmonary embolism (PE) suspected, positive D-dimer

EXAM:
CT ANGIOGRAPHY CHEST WITH CONTRAST
TECHNIQUE: Multidetector CT imaging of the chest was performed using the
standard protocol during bolus administration of intravenous
contrast. Multiplanar CT image reconstructions and MIPs were
obtained to evaluate the vascular anatomy.

[Series 8: thins · axial · 0.98mm/px · z∈[+1002,+1240]mm · 15 of 268 slices shown]
[im 15/268  lung]
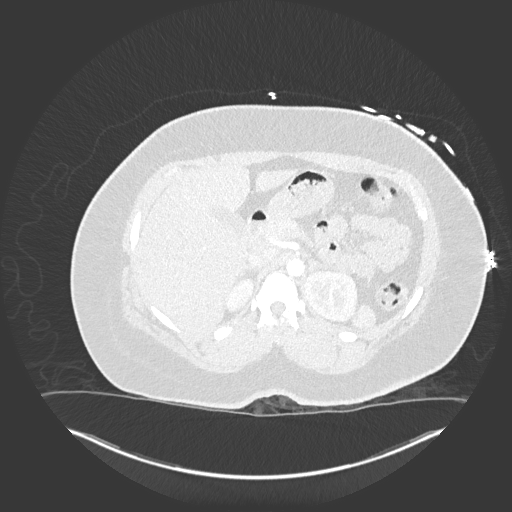
[im 29/268  soft-tissue]
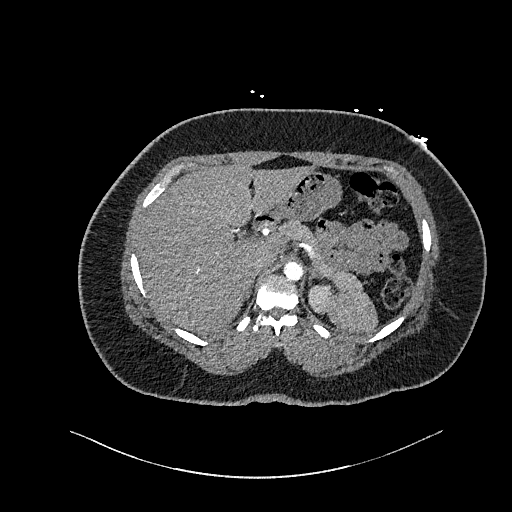
[im 57/268  lung]
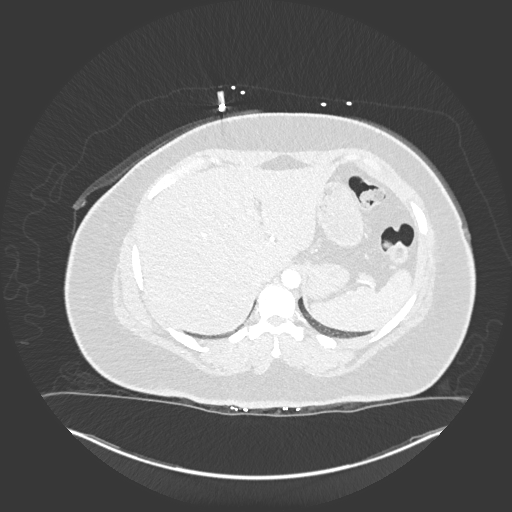
[im 71/268  soft-tissue]
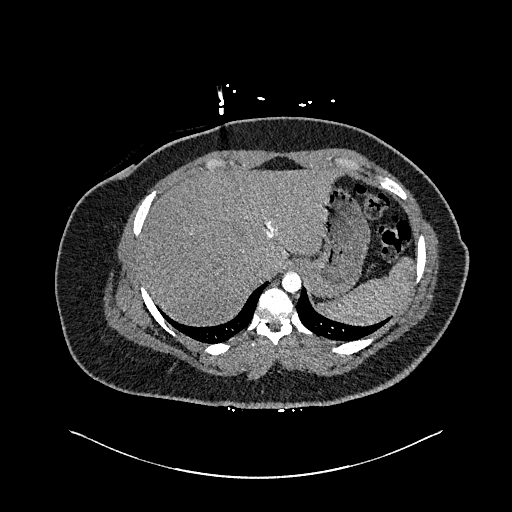
[im 85/268  lung]
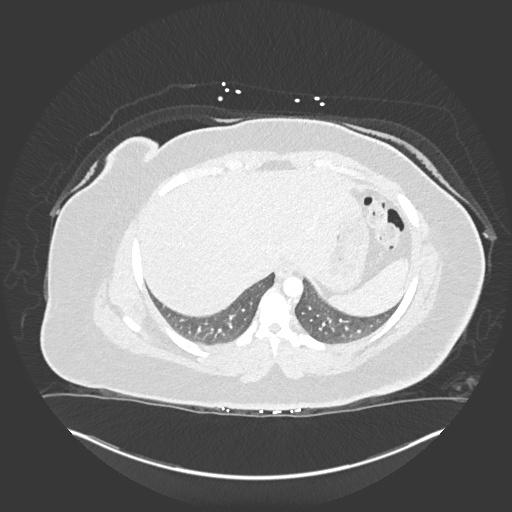
[im 99/268  soft-tissue]
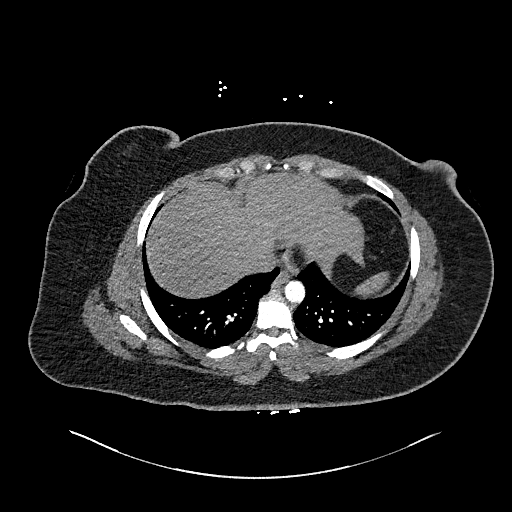
[im 113/268  lung]
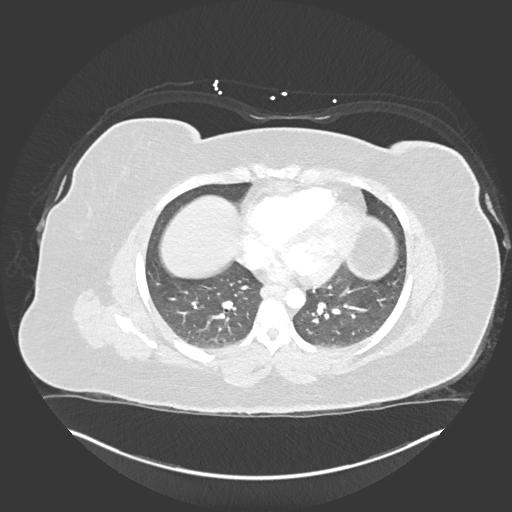
[im 141/268  soft-tissue]
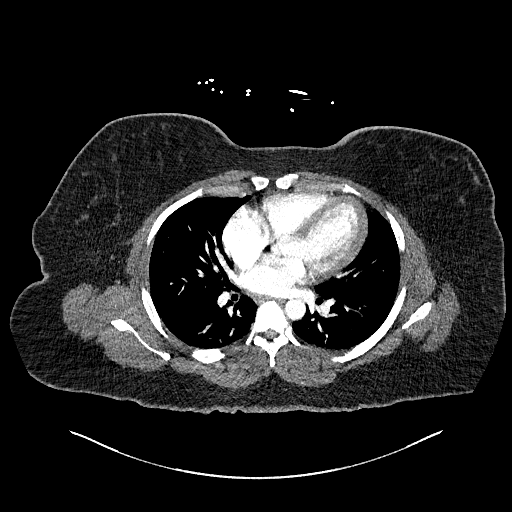
[im 155/268  lung]
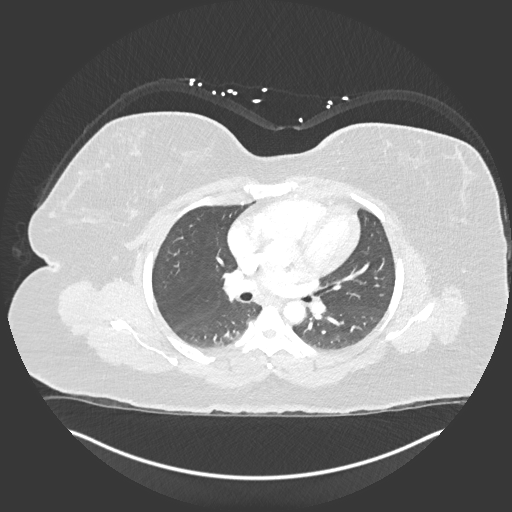
[im 169/268  soft-tissue]
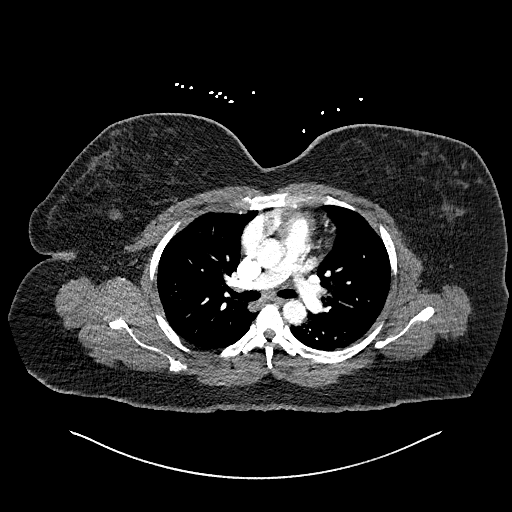
[im 183/268  lung]
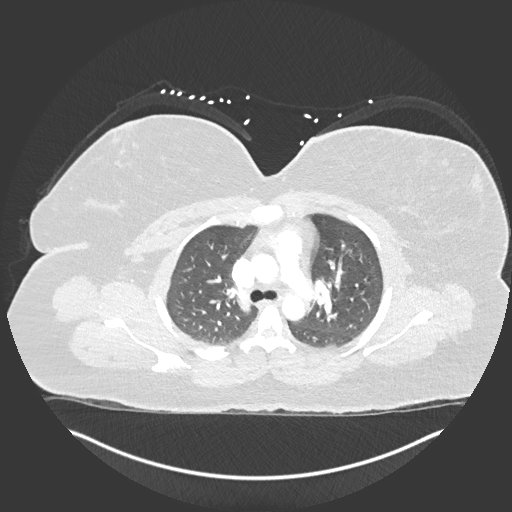
[im 197/268  soft-tissue]
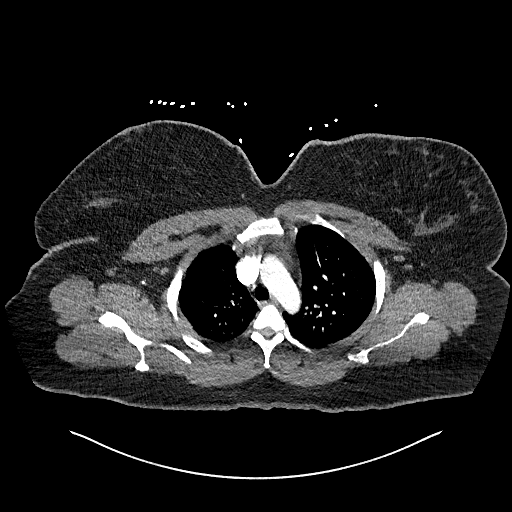
[im 225/268  lung]
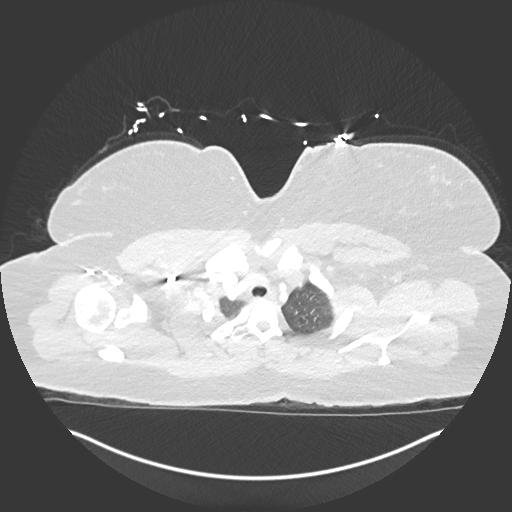
[im 239/268  soft-tissue]
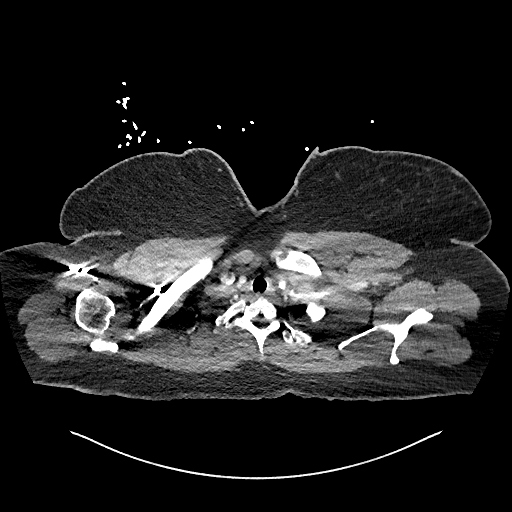
[im 253/268  lung]
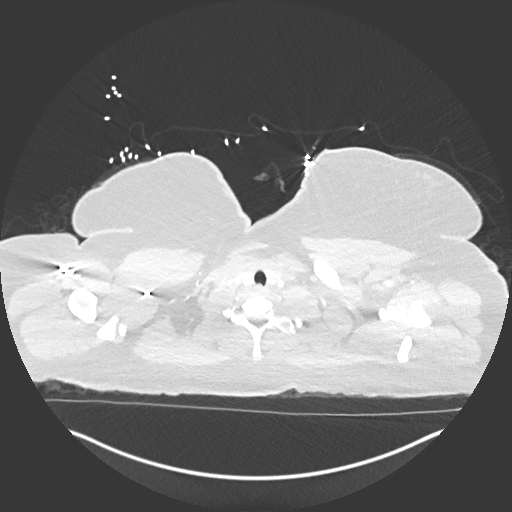

[Series 10: coronal mpr · coronal · 0.58mm/px · 3 of 142 slices shown]
[im 36/142  soft-tissue]
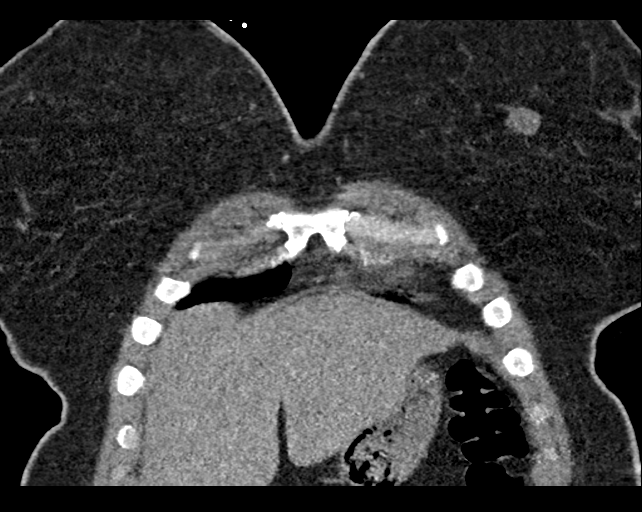
[im 71/142  soft-tissue]
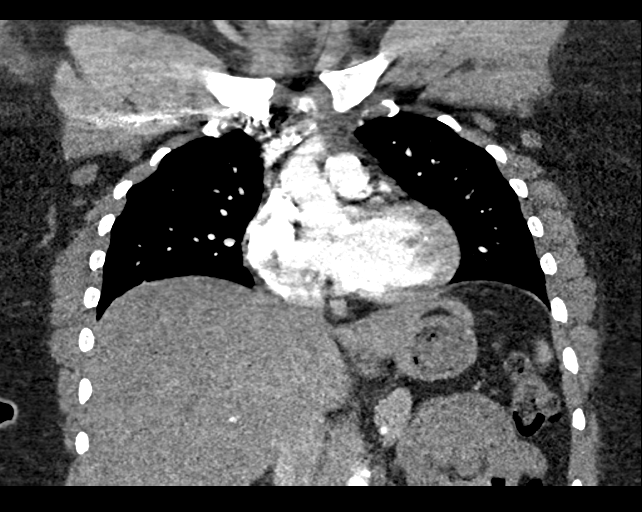
[im 106/142  soft-tissue]
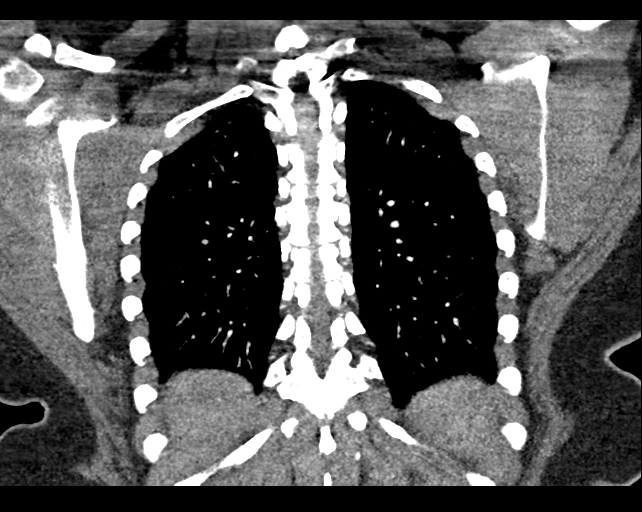

[18 of 46 positions shown; findings below may reference images not displayed]

RADIATION DOSE REDUCTION: This exam was performed according to the
departmental dose-optimization program which includes automated
exposure control, adjustment of the mA and/or kV according to
patient size and/or use of iterative reconstruction technique.

CONTRAST:  67mL OMNIPAQUE IOHEXOL 350 MG/ML SOLN
FINDINGS: Cardiovascular: Satisfactory opacification of the pulmonary arteries
to the segmental level. No evidence of pulmonary embolism. Thoracic
aorta is normal in course and caliber. Normal heart size. No
pericardial effusion.

Mediastinum/Nodes: No enlarged mediastinal, hilar, or axillary lymph
nodes. Thyroid gland, trachea, and esophagus demonstrate no
significant findings.

Lungs/Pleura: Lungs are clear. No pleural effusion or pneumothorax.

Upper Abdomen: Diffusely decreased attenuation of the hepatic
parenchyma. No acute findings within the included upper abdomen.

Musculoskeletal: No acute osseous abnormality. No suspicious bone
lesion. Rounded 2.1 x 2.1 cm lesion within the upper left breast
(series 7, image 20).

Review of the MIP images confirms the above findings.
IMPRESSION: 1. No evidence of pulmonary embolism or other acute intrathoracic
process.
2. Rounded 2.1 x 2.1 cm lesion within the upper left breast,
indeterminate. Recommend follow-up with nonemergent diagnostic
mammography.
3. Hepatic steatosis.
